# Patient Record
Sex: Male | Born: 1990 | Race: Black or African American | Hispanic: No | Marital: Single | Smoking: Never smoker
Health system: Southern US, Community
[De-identification: ages and names within clinical notes are randomized; demographics above are authoritative.]

## PROBLEM LIST (undated history)

## (undated) DIAGNOSIS — G809 Cerebral palsy, unspecified: Secondary | ICD-10-CM

## (undated) DIAGNOSIS — F909 Attention-deficit hyperactivity disorder, unspecified type: Secondary | ICD-10-CM

## (undated) DIAGNOSIS — R569 Unspecified convulsions: Secondary | ICD-10-CM

## (undated) DIAGNOSIS — N3281 Overactive bladder: Secondary | ICD-10-CM

## (undated) DIAGNOSIS — F840 Autistic disorder: Secondary | ICD-10-CM

## (undated) DIAGNOSIS — F79 Unspecified intellectual disabilities: Secondary | ICD-10-CM

## (undated) HISTORY — DX: Attention-deficit hyperactivity disorder, unspecified type: F90.9

## (undated) HISTORY — DX: Cerebral palsy, unspecified: G80.9

## (undated) HISTORY — PX: SHUNT REPLACEMENT: SHX5403

## (undated) HISTORY — DX: Autistic disorder: F84.0

---

## 2012-10-10 ENCOUNTER — Ambulatory Visit
Admission: RE | Admit: 2012-10-10 | Discharge: 2012-10-10 | Disposition: A | Discharge status: Home / Self Care | Payer: Medicare Other | Source: Ambulatory Visit | Attending: Family Medicine | Admitting: Family Medicine

## 2012-10-10 ENCOUNTER — Other Ambulatory Visit: Payer: Self-pay | Admitting: Family Medicine

## 2012-10-10 DIAGNOSIS — R1110 Vomiting, unspecified: Secondary | ICD-10-CM

## 2012-10-10 DIAGNOSIS — R630 Anorexia: Secondary | ICD-10-CM

## 2012-10-18 ENCOUNTER — Encounter: Payer: Self-pay | Admitting: Gastroenterology

## 2012-11-12 ENCOUNTER — Ambulatory Visit (INDEPENDENT_AMBULATORY_CARE_PROVIDER_SITE_OTHER)
Admission: RE | Admit: 2012-11-12 | Discharge: 2012-11-12 | Disposition: A | Discharge status: Home / Self Care | Payer: Medicare Other | Source: Ambulatory Visit | Attending: Gastroenterology | Admitting: Gastroenterology

## 2012-11-12 ENCOUNTER — Other Ambulatory Visit (INDEPENDENT_AMBULATORY_CARE_PROVIDER_SITE_OTHER): Payer: Medicare Other

## 2012-11-12 ENCOUNTER — Ambulatory Visit (INDEPENDENT_AMBULATORY_CARE_PROVIDER_SITE_OTHER): Payer: Medicare Other | Admitting: Gastroenterology

## 2012-11-12 ENCOUNTER — Encounter: Payer: Self-pay | Admitting: Gastroenterology

## 2012-11-12 VITALS — BP 114/70 | HR 84 | Ht 64.0 in | Wt 113.0 lb

## 2012-11-12 DIAGNOSIS — Z982 Presence of cerebrospinal fluid drainage device: Secondary | ICD-10-CM

## 2012-11-12 DIAGNOSIS — R112 Nausea with vomiting, unspecified: Secondary | ICD-10-CM

## 2012-11-12 LAB — COMPREHENSIVE METABOLIC PANEL
Albumin: 5.1 g/dL (ref 3.5–5.2)
Alkaline Phosphatase: 48 U/L (ref 39–117)
BUN: 23 mg/dL (ref 6–23)
CO2: 31 mEq/L (ref 19–32)
Calcium: 9.8 mg/dL (ref 8.4–10.5)
Chloride: 102 mEq/L (ref 96–112)
GFR: 113.47 mL/min (ref >60.00)
Glucose, Bld: 94 mg/dL (ref 70–99)
Potassium: 4.5 mEq/L (ref 3.5–5.1)
Sodium: 138 mEq/L (ref 135–145)
Total Protein: 8 g/dL (ref 6.0–8.3)

## 2012-11-12 LAB — CBC WITH DIFFERENTIAL/PLATELET
Basophils Relative: 0.5 % (ref 0.0–3.0)
Eosinophils Relative: 1.6 % (ref 0.0–5.0)
MCV: 90.5 fl (ref 78.0–100.0)
Monocytes Absolute: 0.6 10*3/uL (ref 0.1–1.0)
Monocytes Relative: 6.9 % (ref 3.0–12.0)
Neutrophils Relative %: 59 % (ref 43.0–77.0)
Platelets: 383 10*3/uL (ref 150.0–400.0)
RBC: 5.72 Mil/uL (ref 4.22–5.81)
WBC: 9 10*3/uL (ref 4.5–10.5)

## 2012-11-12 MED ORDER — IOHEXOL 300 MG/ML  SOLN: 80.0000 mL | Freq: Once | INTRAMUSCULAR | Status: AC | PRN

## 2012-11-12 NOTE — Patient Instructions (Addendum)
You will go to the basement today for labs You are scheduled for a CT scan at [facility] at [facility] at 4:15pm You can go over now to there office now if you would like Nothing else to eat or drink until after the CT scan

## 2012-11-12 NOTE — Assessment & Plan Note (Signed)
It is very difficult to assess this patient since he is not communicative. By exam there is nothing to suggest an acute intra-abdominal process. Anorexia and vomiting could be do to a malfunctioning VP shunt causing increased intracranial pressures. Esophageal stricture, ulcer or nonulcer dyspepsia are other possibilities.  Recommendations #1 CT of the head #2 to consider upper endoscopy if #1 is negative

## 2012-11-12 NOTE — Progress Notes (Signed)
History of Present Illness: 21 year old African American male with autism, cerebral palsy, mental retardation, with a VP shunt, referred for evaluation of anorexia, vomiting and weight loss. According to his mother, who is his caretaker, he has been eating very poorly for several weeks. He's lost weight. He's been vomiting and drooling. She thinks he may be in pain although he is unable to express himself.  An abdominal series 2 months ago was negative. The patient is noncommunicative.    Past Medical History  Diagnosis Date  . Autistic disorder   . Cerebral palsy   . Mental retardation   . Hyperactive    Past Surgical History  Procedure Date  . Shunt replacement    family history is not on file.  He is adopted. Current Outpatient Prescriptions  Medication Sig Dispense Refill  . ALPRAZolam (XANAX) 0.5 MG tablet       . MYRBETRIQ 25 MG TB24       . risperiDONE (RISPERDAL) 2 MG tablet       . SEROQUEL XR 50 MG TB24       . VESICARE 10 MG tablet        Allergies as of 11/12/2012  . (No Known Allergies)    reports that he has never smoked. He has never used smokeless tobacco. He reports that he does not drink alcohol or use illicit drugs.     Review of Systems: Pertinent positive and negative review of systems were noted in the above HPI section. All other review of systems were otherwise negative.  Vital signs were reviewed in today's medical record Physical Exam: General: Well developed , well nourished, no acute distress Head: Normocephalic and atraumatic Eyes:  sclerae anicteric, EOMI Ears: Normal auditory acuity Mouth: No deformity or lesions Neck: Supple, no masses or thyromegaly Lungs: Clear throughout to auscultation Heart: Regular rate and rhythm; no murmurs, rubs or bruits Abdomen: Soft, non tender and non distended. No masses, hepatosplenomegaly or hernias noted. Normal Bowel sounds Rectal:deferred Musculoskeletal: Symmetrical with no gross deformities  Skin: No  lesions on visible extremities Pulses:  Normal pulses noted Extremities: No clubbing, cyanosis, edema or deformities noted Neurological: Alert , moves all 4 extremities. He is noncommunicative.  Cervical Nodes:  No significant cervical adenopathy Inguinal Nodes: No significant inguinal adenopathy Psychological:  Alert and cooperative. Normal mood and affect

## 2012-11-14 NOTE — Progress Notes (Signed)
Quick Note:  The patient's symptoms can be explained by CT findings. Pacemaker referral to the neurosurgical group. Inform the family and the referring group that the patient is having nausea and vomiting and apparent abnormality of the ventricular peritoneal shunt ______

## 2012-11-19 ENCOUNTER — Encounter (HOSPITAL_COMMUNITY): Payer: Self-pay | Admitting: Pharmacy Technician

## 2012-11-19 ENCOUNTER — Other Ambulatory Visit (INDEPENDENT_AMBULATORY_CARE_PROVIDER_SITE_OTHER): Payer: Self-pay | Admitting: General Surgery

## 2012-11-19 ENCOUNTER — Other Ambulatory Visit: Payer: Self-pay | Admitting: Neurosurgery

## 2012-11-21 ENCOUNTER — Encounter (HOSPITAL_COMMUNITY): Payer: Self-pay | Admitting: *Deleted

## 2012-11-21 MED ORDER — CEFAZOLIN SODIUM-DEXTROSE 2-3 GM-% IV SOLR
2.0000 g | INTRAVENOUS | Status: AC
  Administered 2012-11-22: 2 g via INTRAVENOUS
  Filled 2012-11-21: qty 50

## 2012-11-22 ENCOUNTER — Encounter (HOSPITAL_COMMUNITY): Payer: Self-pay | Admitting: Certified Registered"

## 2012-11-22 ENCOUNTER — Encounter (HOSPITAL_COMMUNITY): Payer: Self-pay

## 2012-11-22 ENCOUNTER — Inpatient Hospital Stay (HOSPITAL_COMMUNITY)
Admission: RE | Admit: 2012-11-22 | Discharge: 2012-11-23 | DRG: 041 | Disposition: A | Discharge status: Home / Self Care | Payer: PRIVATE HEALTH INSURANCE | Source: Ambulatory Visit | Attending: Neurosurgery | Admitting: Neurosurgery

## 2012-11-22 ENCOUNTER — Ambulatory Visit (HOSPITAL_COMMUNITY): Payer: PRIVATE HEALTH INSURANCE | Admitting: Certified Registered"

## 2012-11-22 ENCOUNTER — Encounter (HOSPITAL_COMMUNITY)
Admission: RE | Disposition: A | Discharge status: Home / Self Care | Payer: Self-pay | Source: Ambulatory Visit | Attending: Neurosurgery

## 2012-11-22 DIAGNOSIS — Z79899 Other long term (current) drug therapy: Secondary | ICD-10-CM

## 2012-11-22 DIAGNOSIS — T85890A Other specified complication of nervous system prosthetic devices, implants and grafts, initial encounter: Principal | ICD-10-CM | POA: Diagnosis present

## 2012-11-22 DIAGNOSIS — F840 Autistic disorder: Secondary | ICD-10-CM | POA: Diagnosis present

## 2012-11-22 DIAGNOSIS — T85898A Other specified complication of other internal prosthetic devices, implants and grafts, initial encounter: Secondary | ICD-10-CM

## 2012-11-22 DIAGNOSIS — Y838 Other surgical procedures as the cause of abnormal reaction of the patient, or of later complication, without mention of misadventure at the time of the procedure: Secondary | ICD-10-CM | POA: Diagnosis present

## 2012-11-22 DIAGNOSIS — F79 Unspecified intellectual disabilities: Secondary | ICD-10-CM | POA: Diagnosis present

## 2012-11-22 DIAGNOSIS — G809 Cerebral palsy, unspecified: Secondary | ICD-10-CM | POA: Diagnosis present

## 2012-11-22 DIAGNOSIS — G911 Obstructive hydrocephalus: Secondary | ICD-10-CM | POA: Diagnosis present

## 2012-11-22 DIAGNOSIS — Y92009 Unspecified place in unspecified non-institutional (private) residence as the place of occurrence of the external cause: Secondary | ICD-10-CM

## 2012-11-22 HISTORY — DX: Unspecified intellectual disabilities: F79

## 2012-11-22 HISTORY — PX: LAPAROSCOPIC REVISION VENTRICULAR-PERITONEAL (V-P) SHUNT: SHX5924

## 2012-11-22 HISTORY — PX: SHUNT REVISION VENTRICULAR-PERITONEAL: SHX6094

## 2012-11-22 HISTORY — DX: Overactive bladder: N32.81

## 2012-11-22 LAB — SURGICAL PCR SCREEN: MRSA, PCR: NEGATIVE

## 2012-11-22 LAB — CBC
Hemoglobin: 17.6 g/dL — ABNORMAL HIGH (ref 13.0–17.0)
MCH: 29.9 pg (ref 26.0–34.0)
MCV: 87.8 fL (ref 78.0–100.0)
RBC: 5.88 MIL/uL — ABNORMAL HIGH (ref 4.22–5.81)

## 2012-11-22 SURGERY — REVISION, SHUNT, VENTRICULOPERITONEAL
Anesthesia: General | Wound class: Clean

## 2012-11-22 MED ORDER — SODIUM CHLORIDE 0.9 % IR SOLN
Status: DC | PRN
  Administered 2012-11-22: 1000 mL

## 2012-11-22 MED ORDER — QUETIAPINE FUMARATE ER 50 MG PO TB24
100.0000 mg | ORAL_TABLET | Freq: Every day | ORAL | Status: DC
  Administered 2012-11-22: 100 mg via ORAL
  Filled 2012-11-22 (×2): qty 2

## 2012-11-22 MED ORDER — GLYCOPYRROLATE 0.2 MG/ML IJ SOLN
0.2000 mg | Freq: Once | INTRAMUSCULAR | Status: AC
  Administered 2012-11-22: 0.2 mg via INTRAVENOUS

## 2012-11-22 MED ORDER — MENTHOL 3 MG MT LOZG: 1.0000 | LOZENGE | OROMUCOSAL | Status: DC | PRN

## 2012-11-22 MED ORDER — MIRABEGRON ER 25 MG PO TB24
25.0000 mg | ORAL_TABLET | Freq: Every day | ORAL | Status: DC
  Administered 2012-11-23: 25 mg via ORAL
  Filled 2012-11-22: qty 1

## 2012-11-22 MED ORDER — NEOSTIGMINE METHYLSULFATE 1 MG/ML IJ SOLN
INTRAMUSCULAR | Status: DC | PRN
  Administered 2012-11-22: 4 mg via INTRAVENOUS

## 2012-11-22 MED ORDER — SODIUM CHLORIDE 0.9 % IJ SOLN: 3.0000 mL | INTRAMUSCULAR | Status: DC | PRN

## 2012-11-22 MED ORDER — PROPOFOL 10 MG/ML IV BOLUS
INTRAVENOUS | Status: DC | PRN
  Administered 2012-11-22 (×2): 180 mg via INTRAVENOUS

## 2012-11-22 MED ORDER — ACETAMINOPHEN 650 MG RE SUPP: 650.0000 mg | RECTAL | Status: DC | PRN

## 2012-11-22 MED ORDER — PHENOL 1.4 % MT LIQD: 1.0000 | OROMUCOSAL | Status: DC | PRN

## 2012-11-22 MED ORDER — HYDROMORPHONE HCL PF 1 MG/ML IJ SOLN: 0.2500 mg | INTRAMUSCULAR | Status: DC | PRN

## 2012-11-22 MED ORDER — LIDOCAINE HCL (CARDIAC) 20 MG/ML IV SOLN
INTRAVENOUS | Status: DC | PRN
  Administered 2012-11-22 (×2): 40 mg via INTRAVENOUS

## 2012-11-22 MED ORDER — KCL IN DEXTROSE-NACL 20-5-0.45 MEQ/L-%-% IV SOLN
INTRAVENOUS | Status: DC
  Administered 2012-11-22: 21:00:00 via INTRAVENOUS
  Filled 2012-11-22 (×2): qty 1000

## 2012-11-22 MED ORDER — DOCUSATE SODIUM 100 MG PO CAPS
100.0000 mg | ORAL_CAPSULE | Freq: Two times a day (BID) | ORAL | Status: DC
  Administered 2012-11-22 – 2012-11-23 (×2): 100 mg via ORAL
  Filled 2012-11-22 (×2): qty 1

## 2012-11-22 MED ORDER — ACETAMINOPHEN 10 MG/ML IV SOLN: 1000.0000 mg | Freq: Once | INTRAVENOUS | Status: DC | PRN

## 2012-11-22 MED ORDER — ONDANSETRON HCL 4 MG/2ML IJ SOLN: 4.0000 mg | Freq: Once | INTRAMUSCULAR | Status: DC | PRN

## 2012-11-22 MED ORDER — ALUM & MAG HYDROXIDE-SIMETH 200-200-20 MG/5ML PO SUSP: 30.0000 mL | Freq: Four times a day (QID) | ORAL | Status: DC | PRN

## 2012-11-22 MED ORDER — GLYCOPYRROLATE 0.2 MG/ML IJ SOLN
INTRAMUSCULAR | Status: DC | PRN
  Administered 2012-11-22: 0.6 mg via INTRAVENOUS

## 2012-11-22 MED ORDER — ONDANSETRON HCL 4 MG/2ML IJ SOLN: 4.0000 mg | INTRAMUSCULAR | Status: DC | PRN

## 2012-11-22 MED ORDER — ROCURONIUM BROMIDE 100 MG/10ML IV SOLN
INTRAVENOUS | Status: DC | PRN
  Administered 2012-11-22: 15 mg via INTRAVENOUS
  Administered 2012-11-22: 10 mg via INTRAVENOUS
  Administered 2012-11-22: 35 mg via INTRAVENOUS

## 2012-11-22 MED ORDER — PANTOPRAZOLE SODIUM 40 MG IV SOLR
40.0000 mg | Freq: Every day | INTRAVENOUS | Status: DC
  Administered 2012-11-22: 40 mg via INTRAVENOUS
  Filled 2012-11-22 (×2): qty 40

## 2012-11-22 MED ORDER — FENTANYL CITRATE 0.05 MG/ML IJ SOLN
INTRAMUSCULAR | Status: DC | PRN
  Administered 2012-11-22: 100 ug via INTRAVENOUS
  Administered 2012-11-22: 50 ug via INTRAVENOUS

## 2012-11-22 MED ORDER — ONDANSETRON HCL 4 MG/2ML IJ SOLN
INTRAMUSCULAR | Status: DC | PRN
  Administered 2012-11-22: 4 mg via INTRAVENOUS

## 2012-11-22 MED ORDER — CEFAZOLIN SODIUM 1-5 GM-% IV SOLN
1.0000 g | Freq: Three times a day (TID) | INTRAVENOUS | Status: AC
  Administered 2012-11-23 (×2): 1 g via INTRAVENOUS
  Filled 2012-11-22 (×2): qty 50

## 2012-11-22 MED ORDER — LIDOCAINE-EPINEPHRINE 1 %-1:100000 IJ SOLN
INTRAMUSCULAR | Status: DC | PRN
  Administered 2012-11-22: 1.5 mL via INTRADERMAL

## 2012-11-22 MED ORDER — BUPIVACAINE HCL (PF) 0.5 % IJ SOLN
INTRAMUSCULAR | Status: DC | PRN
  Administered 2012-11-22: 1.5 mL

## 2012-11-22 MED ORDER — SODIUM CHLORIDE 0.9 % IJ SOLN
3.0000 mL | Freq: Two times a day (BID) | INTRAMUSCULAR | Status: DC
  Administered 2012-11-23: 3 mL via INTRAVENOUS

## 2012-11-22 MED ORDER — SODIUM CHLORIDE 0.9 % IV SOLN: 250.0000 mL | INTRAVENOUS | Status: DC

## 2012-11-22 MED ORDER — MORPHINE SULFATE 2 MG/ML IJ SOLN: 1.0000 mg | INTRAMUSCULAR | Status: DC | PRN

## 2012-11-22 MED ORDER — LACTATED RINGERS IV SOLN
INTRAVENOUS | Status: DC
  Administered 2012-11-22: 17:00:00 via INTRAVENOUS

## 2012-11-22 MED ORDER — KCL IN DEXTROSE-NACL 20-5-0.45 MEQ/L-%-% IV SOLN
INTRAVENOUS | Status: AC
  Filled 2012-11-22: qty 1000

## 2012-11-22 MED ORDER — MUPIROCIN 2 % EX OINT
TOPICAL_OINTMENT | CUTANEOUS | Status: AC
  Filled 2012-11-22: qty 22

## 2012-11-22 MED ORDER — HYDROCODONE-ACETAMINOPHEN 5-325 MG PO TABS
1.0000 | ORAL_TABLET | ORAL | Status: DC | PRN
  Administered 2012-11-22: 2 via ORAL
  Filled 2012-11-22: qty 2

## 2012-11-22 MED ORDER — OXYCODONE-ACETAMINOPHEN 5-325 MG PO TABS: 1.0000 | ORAL_TABLET | ORAL | Status: DC | PRN

## 2012-11-22 MED ORDER — ACETAMINOPHEN 325 MG PO TABS: 650.0000 mg | ORAL_TABLET | ORAL | Status: DC | PRN

## 2012-11-22 SURGICAL SUPPLY — 45 items
BENZOIN TINCTURE PRP APPL 2/3 (GAUZE/BANDAGES/DRESSINGS) ×2 IMPLANT
BLADE SURG 15 STRL LF DISP TIS (BLADE) ×1 IMPLANT
BLADE SURG 15 STRL SS (BLADE) ×1
CANISTER SUCTION 2500CC (MISCELLANEOUS) IMPLANT
CORDS BIPOLAR (ELECTRODE) ×2 IMPLANT
COVER MAYO STAND STRL (DRAPES) ×2 IMPLANT
DECANTER SPIKE VIAL GLASS SM (MISCELLANEOUS) ×2 IMPLANT
DRAPE INCISE IOBAN 66X45 STRL (DRAPES) ×2 IMPLANT
DRAPE INCISE IOBAN 85X60 (DRAPES) ×2 IMPLANT
DRSG TEGADERM 4X4.75 (GAUZE/BANDAGES/DRESSINGS) ×2 IMPLANT
GAUZE SPONGE 2X2 8PLY STRL LF (GAUZE/BANDAGES/DRESSINGS) ×1 IMPLANT
GLOVE BIO SURGEON STRL SZ 6.5 (GLOVE) ×4 IMPLANT
GLOVE BIO SURGEON STRL SZ8 (GLOVE) ×2 IMPLANT
GLOVE BIOGEL PI IND STRL 7.0 (GLOVE) ×1 IMPLANT
GLOVE BIOGEL PI IND STRL 7.5 (GLOVE) ×1 IMPLANT
GLOVE BIOGEL PI IND STRL 8 (GLOVE) ×2 IMPLANT
GLOVE BIOGEL PI INDICATOR 7.0 (GLOVE) ×1
GLOVE BIOGEL PI INDICATOR 7.5 (GLOVE) ×1
GLOVE BIOGEL PI INDICATOR 8 (GLOVE) ×2
GLOVE ECLIPSE 7.5 STRL STRAW (GLOVE) ×4 IMPLANT
GLOVE ECLIPSE 8.0 STRL XLNG CF (GLOVE) ×2 IMPLANT
GOWN BRE IMP SLV AUR LG STRL (GOWN DISPOSABLE) ×4 IMPLANT
GOWN BRE IMP SLV AUR XL STRL (GOWN DISPOSABLE) ×4 IMPLANT
GOWN STRL NON-REIN LRG LVL3 (GOWN DISPOSABLE) ×4 IMPLANT
IV NS 1000ML (IV SOLUTION) ×1
IV NS 1000ML BAXH (IV SOLUTION) ×1 IMPLANT
NS IRRIG 1000ML POUR BTL (IV SOLUTION) ×2 IMPLANT
PAD ARMBOARD 7.5X6 YLW CONV (MISCELLANEOUS) ×4 IMPLANT
PENCIL BUTTON HOLSTER BLD 10FT (ELECTRODE) ×2 IMPLANT
SCISSORS LAP 5X35 DISP (ENDOMECHANICALS) IMPLANT
SET IRRIG TUBING LAPAROSCOPIC (IRRIGATION / IRRIGATOR) ×2 IMPLANT
SET SHEATH INTRODUCER 10FR (MISCELLANEOUS) IMPLANT
SHEATH COOK PEEL AWAY SET 9F (SHEATH) ×2 IMPLANT
SLEEVE ENDOPATH XCEL 5M (ENDOMECHANICALS) ×2 IMPLANT
SPONGE GAUZE 2X2 STER 10/PKG (GAUZE/BANDAGES/DRESSINGS) ×1
STRIP CLOSURE SKIN 1/2X4 (GAUZE/BANDAGES/DRESSINGS) ×2 IMPLANT
SUT MNCRL AB 4-0 PS2 18 (SUTURE) ×2 IMPLANT
SUT MON AB 4-0 PC3 18 (SUTURE) IMPLANT
SUT VIC AB 3-0 SH 27 (SUTURE) ×1
SUT VIC AB 3-0 SH 27XBRD (SUTURE) ×1 IMPLANT
TOWEL OR 17X24 6PK STRL BLUE (TOWEL DISPOSABLE) ×4 IMPLANT
TOWEL OR 17X26 10 PK STRL BLUE (TOWEL DISPOSABLE) ×2 IMPLANT
TRAY LAPAROSCOPIC (CUSTOM PROCEDURE TRAY) ×2 IMPLANT
TROCAR XCEL BLUNT TIP 100MML (ENDOMECHANICALS) ×2 IMPLANT
TROCAR XCEL NON-BLD 5MMX100MML (ENDOMECHANICALS) ×2 IMPLANT

## 2012-11-22 NOTE — Progress Notes (Signed)
Heart rate flucutates from high 40's to 90's even after having robinal Dr. Randa Evens aware

## 2012-11-22 NOTE — Anesthesia Preprocedure Evaluation (Addendum)
Anesthesia Evaluation  Patient identified by MRN, date of birth, ID band Patient awake    Reviewed: Allergy & Precautions, H&P , NPO status , Patient's Chart, lab work & pertinent test results  Airway Mallampati: II TM Distance: >3 FB Neck ROM: Full    Dental  (+) Teeth Intact   Pulmonary  breath sounds clear to auscultation        Cardiovascular Rhythm:Regular Rate:Normal     Neuro/Psych PSYCHIATRIC DISORDERS  Neuromuscular disease    GI/Hepatic   Endo/Other    Renal/GU      Musculoskeletal   Abdominal   Peds  Hematology   Anesthesia Other Findings   Reproductive/Obstetrics                          Anesthesia Physical Anesthesia Plan  ASA: III  Anesthesia Plan: General   Post-op Pain Management:    Induction: Intravenous  Airway Management Planned: Oral ETT  Additional Equipment:   Intra-op Plan:   Post-operative Plan: Extubation in OR  Informed Consent: I have reviewed the patients History and Physical, chart, labs and discussed the procedure including the risks, benefits and alternatives for the proposed anesthesia with the patient or authorized representative who has indicated his/her understanding and acceptance.   Dental advisory given  Plan Discussed with: Anesthesiologist, Surgeon and CRNA  Anesthesia Plan Comments: (Chronic hydrocephalus with lifelong VP shunt now with outflow malfunction  Cerebral Palsy with autism  Kipp Brood, MD )       Anesthesia Quick Evaluation

## 2012-11-22 NOTE — Progress Notes (Signed)
Dr. Venetia Maxon returned call informed of patient's fluctuating heart rate and that he received robinal on arrival to PACU. Dr. Venetia Maxon feels it isn't a problem

## 2012-11-22 NOTE — Anesthesia Procedure Notes (Signed)
Procedure Name: Intubation Date/Time: 11/22/2012 5:59 PM Performed by: Arlice Colt B Pre-anesthesia Checklist: Patient identified, Emergency Drugs available, Suction available, Patient being monitored and Timeout performed Patient Re-evaluated:Patient Re-evaluated prior to inductionOxygen Delivery Method: Circle system utilized Preoxygenation: Pre-oxygenation with 100% oxygen Intubation Type: IV induction Ventilation: Mask ventilation without difficulty Laryngoscope Size: Mac and 3 Grade View: Grade I Tube type: Oral Tube size: 7.5 mm Number of attempts: 1 Airway Equipment and Method: Stylet Secured at: 22 cm Tube secured with: Tape Dental Injury: Teeth and Oropharynx as per pre-operative assessment

## 2012-11-22 NOTE — Transfer of Care (Signed)
Immediate Anesthesia Transfer of Care Note  Patient: Melvin Moore  Procedure(s) Performed: Procedure(s) (LRB) with comments: SHUNT REVISION VENTRICULAR-PERITONEAL (N/A) - Laparoscopic Repositioning of Ventricular-Peritoneal shunt  LAPAROSCOPIC REVISION VENTRICULAR-PERITONEAL (V-P) SHUNT (N/A) - Laparoscopic Repositioning of Ventricular-Peritoneal Shunt  Patient Location: PACU  Anesthesia Type:General  Level of Consciousness: sedated  Airway & Oxygen Therapy: Patient connected to face mask oxygen  Post-op Assessment: Report given to PACU RN, Post -op Vital signs reviewed and stable and Patient moving all extremities X 4  Post vital signs: Reviewed and stable  Complications: No apparent anesthesia complications

## 2012-11-22 NOTE — Anesthesia Postprocedure Evaluation (Signed)
  Anesthesia Post-op Note  Patient: Melvin Moore  Procedure(s) Performed: Procedure(s) (LRB) with comments: SHUNT REVISION VENTRICULAR-PERITONEAL (N/A) - Laparoscopic Repositioning of Ventricular-Peritoneal shunt  LAPAROSCOPIC REVISION VENTRICULAR-PERITONEAL (V-P) SHUNT (N/A) - Laparoscopic Repositioning of Ventricular-Peritoneal Shunt  Patient Location: PACU  Anesthesia Type:General  Level of Consciousness: awake  Airway and Oxygen Therapy: Patient Spontanous Breathing  Post-op Pain: mild  Post-op Assessment: Post-op Vital signs reviewed  Post-op Vital Signs: Reviewed  Complications: No apparent anesthesia complications

## 2012-11-22 NOTE — H&P (Signed)
Reason for Consult:Hydrocephalus and autism Referring Physician: Burdett Pinzon is an 21 y.o. male.  HPI: Disabled autist with congenital hydrocephalus last shunt revision [facility] 7 years ago, now with nausea, vomiting, subdued affect.  Head CT shows HCP.  Shunt series shows abdominal catheter is no longer in abdomen.  He is loosing weight.  His adoptive mother was with  Him and served as historian as he is non-verbal.   Past Medical History  Diagnosis Date  . Cerebral palsy   . Hyperactive   . Overactive bladder   . Autistic disorder   . Mental retardation   . MR (mental retardation)     "75%" per mother    Past Surgical History  Procedure Date  . Shunt replacement     Family History  Problem Relation Age of Onset  . Adopted: Yes    Social History:  reports that he has never smoked. He has never used smokeless tobacco. He reports that he does not drink alcohol or use illicit drugs.  Allergies: No Known Allergies  Medications:   Results for orders placed during the hospital encounter of 11/22/12 (from the past 48 hour(s))  CBC     Status: Abnormal   Collection Time   11/22/12  3:27 PM      Component Value Range Comment   WBC 8.1  4.0 - 10.5 K/uL    RBC 5.88 (*) 4.22 - 5.81 MIL/uL    Hemoglobin 17.6 (*) 13.0 - 17.0 g/dL    HCT 47.8  29.5 - 62.1 %    MCV 87.8  78.0 - 100.0 fL    MCH 29.9  26.0 - 34.0 pg    MCHC 34.1  30.0 - 36.0 g/dL    RDW 30.8  65.7 - 84.6 %    Platelets 271  150 - 400 K/uL     No results found.  Review of Systems -    Blood pressure 121/104, pulse 92, temperature 97.5 F (36.4 C), temperature source Oral, resp. rate 18, height 5\' 4"  (1.626 m), weight 51.256 kg (113 lb), SpO2 97.00%. Nonverbal, pleasant, stares.  Attends to voice.  Palpable mass over abdomen at site of abdominal catheter.  Shunt system appears intact over scalp. Assessment/Plan: VP shunt malfunction.  May be entirely secondary to abdominal catheter and not cranial  issue.  Will open over abdominal wound with Dr. Abbey Chatters and replace catheter in peritoneal cavity if there is spontaneous flow, otherwise, may have to revise cranial catheter as well.  Dorian Heckle, MD 11/22/2012, 4:52 PM

## 2012-11-22 NOTE — Progress Notes (Signed)
Dr. Randa Evens came to bedside wants DR. Venetia Maxon to be aware of heart fluctuations in the high 40"s to 90's Dr. Venetia Maxon paged

## 2012-11-22 NOTE — Consult Note (Signed)
Reason for Consult:Malfunctioning ventriculoperitoneal shunt Referring Physician: Dr. Maeola Moore  Melvin Moore is an 21 y.o. male.  HPI: He has a long-standing ventriculoperitoneal shunt in for congenital hydrocephalus. Recently he's been more sedated somewhat nauseated. He CT scan does show significant hydrocephalus. A plain abdominal x-ray suggested the shunt may have migrated out of the abdominal cavity. The plan is to see if the shunt is in the abdominal cavity. If it is not and it is functioning it can be placed back into the abdominal cavity under laparoscopic guidance. If the shunt is occluded, it may need to be completely replaced.  Past Medical History  Diagnosis Date  . Cerebral palsy   . Hyperactive   . Overactive bladder   . Autistic disorder   . Mental retardation   . MR (mental retardation)     "75%" per mother    Past Surgical History  Procedure Date  . Shunt replacement     Family History  Problem Relation Age of Onset  . Adopted: Yes    Social History:  reports that he has never smoked. He has never used smokeless tobacco. He reports that he does not drink alcohol or use illicit drugs.  Allergies: No Known Allergies  Prior to Admission medications   Medication Sig Start Date End Date Taking? Authorizing Provider  MYRBETRIQ 25 MG TB24 Take 25 mg by mouth daily.  10/23/12  Yes Historical Provider, MD  SEROQUEL XR 50 MG TB24 Take 100 mg by mouth at bedtime.  09/11/12  Yes Historical Provider, MD     Results for orders placed during the hospital encounter of 11/22/12 (from the past 48 hour(s))  SURGICAL PCR SCREEN     Status: Normal   Collection Time   11/22/12  3:25 PM      Component Value Range Comment   MRSA, PCR NEGATIVE  NEGATIVE    Staphylococcus aureus NEGATIVE  NEGATIVE   CBC     Status: Abnormal   Collection Time   11/22/12  3:27 PM      Component Value Range Comment   WBC 8.1  4.0 - 10.5 K/uL    RBC 5.88 (*) 4.22 - 5.81 MIL/uL    Hemoglobin 17.6 (*) 13.0 - 17.0 g/dL    HCT 16.1  09.6 - 04.5 %    MCV 87.8  78.0 - 100.0 fL    MCH 29.9  26.0 - 34.0 pg    MCHC 34.1  30.0 - 36.0 g/dL    RDW 40.9  81.1 - 91.4 %    Platelets 271  150 - 400 K/uL     No results found.  Review of Systems  Unable to perform ROS  Blood pressure 121/104, pulse 92, temperature 97.5 F (36.4 C), temperature source Oral, resp. rate 18, height 5\' 4"  (1.626 m), weight 113 lb (51.256 kg), SpO2 97.00%. Physical Exam  Constitutional: He appears well-developed and well-nourished.  GI: Soft.       Palpable catheter-type foreign body in left upper quadrant region. Small subxiphoid for incision. Small lower epigastric incision.    Assessment/Plan: Hydrocephalus with malfunctioning ventriculoperitoneal shunt. The shunt may be pulled out of the abdominal cavity as he is very active.  Plan: Laparoscopic assisted placement of ventriculoperitoneal shunt. I've discussed the procedure the risks with his mother. The risks included but not limited to bleeding, infection, wound healing problems, anesthesia, accidental injury to intra-abdominal organs.  Melvin Moore 11/22/2012, 5:31 PM

## 2012-11-22 NOTE — Op Note (Signed)
Operative Note  Melvin Moore male 21 y.o. 11/22/2012  PREOPERATIVE DX:  Hydrocephalus secondary to malfunctioning/malpositioned ventriculoperitoneal shunt  POSTOPERATIVE DX:  Same  PROCEDURE:  Abdominal wall exploration and laparoscopic repositioning of ventriculoperitoneal shunt         Surgeon: Adolph Pollack   Assistants: Maeola Harman M.D.  Anesthesia: General endotracheal anesthesia  Indications: This is a 21 year old autistic male with long-standing hydrocephalus. He has a long-standing ventriculoperitoneal shunt. He's been noted to be more somnolent as well as have problems with some nausea and vomiting. CT scan demonstrates some significant hydrocephalus. Plain x-rays demonstrate that the shunt catheter appears to be outside the peritoneal cavity and tracking back up to the chest cavity. This is also palpable on exam. He now presents for the above procedures.    Procedure Detail:  He was brought to the operating room placed supine on the operating table and a general anesthetic was administered. The hair on the abdominal wall chest, and left-side on the scalp was clipped and these areas were sterilely prepped and draped. A previous incision in the epigastrium was re-incised sharply and the subcutaneous tissue was divided until we identified the shunt catheter. The catheter was somewhat looped with part of it going back up the chest wall. We were able then to manipulate the shunt catheter such that we were able to pull it out. The tip was not in the peritoneal cavity. Once the shunt catheter was pulled out, adequate length was noted and it was draining well.  Next, a small incision was made in the left upper quadrant and access was gained into the peritoneal cavity using a 5 mm Optiview trocar and laparoscope.  A pneumoperitoneum was created by insufflation of CO2 gas. Dense omental adhesions were noted to the abdominal wall. There is no evidence of bleeding or organ injury  under the trocar sites. I carefully separated some of these adhesions allowing me to get into the left lower quadrant. A second 5 mm trocar was and placed through a small incision in the left lower quadrant.  A 16-gauge needle was then introduced into the peritoneal cavity through the small epigastric incision. This was followed by a wire and then a dilator introducer complex. The dilator was removed. The catheter was threaded through the peel-away sheath introducer into the peritoneal cavity and the peel-away sheath was removed.  The shunt catheter was then positioned in the pelvis.  I then inspected the abdominal cavity and there is no evidence of bleeding or organ injury. The trocars were removed and the pneumoperitoneum was released.  The subcutaneous tissue of the epigastric incision was closed with interrupted 3-0 Vicryl sutures. All 3 skin incisions were then closed with 4-0 Monocryl subcuticular stitches. Steri-Strips and sterile dressings were applied.  He tolerated the procedure well without any apparent complications and was taken to the recovery room in satisfactory condition.  Estimated Blood Loss:  less than 100 mL         Drains: none  Blood Given: none          Specimens: None        Complications:  * No complications entered in OR log *         Disposition: PACU - hemodynamically stable.         Condition: stable

## 2012-11-22 NOTE — Progress Notes (Signed)
Dr Mora Bellman called regarding heart rate down to 42 orders received to give robinal 0.2 IVP

## 2012-11-23 MED ORDER — HYDROCODONE-ACETAMINOPHEN 5-325 MG PO TABS: 1.0000 | ORAL_TABLET | ORAL | Status: DC | PRN

## 2012-11-23 NOTE — Progress Notes (Addendum)
General surgery progress note:  Patient is stable. No complaints.  No nausea.  Sister in room. He tolerated breakfast very well.  Exam: Abdomen soft. Nondistended. Nontender. Wounds looked fine.  Assessment/plan:   POD #1. Revision of ventriculoperitoneal shunt with laparoscopic positioning of peritoneal catheter. Doing well from abdominal surgery standpoint. May be discharged home when okay with neurosurgery. Followup with Dr. Okey Dupre about if further problems arise.   Angelia Mould. Derrell Lolling, M.D., [facility] General and Minimally invasive Surgery Breast and Colorectal Surgery Office:   [phone] Pager:   [phone]

## 2012-11-23 NOTE — Progress Notes (Signed)
Subjective: Patient reports appears comfortable.  Objective: Vital signs in last 24 hours: Temp:  [97 F (36.1 C)-99.1 F (37.3 C)] 97.3 F (36.3 C) (12/14 0600) Pulse Rate:  [42-92] 55  (12/14 0600) Resp:  [12-21] 16  (12/14 0600) BP: (91-121)/(42-104) 102/43 mmHg (12/14 0600) SpO2:  [97 %-100 %] 100 % (12/14 0600) Weight:  [50.8 kg (111 lb 15.9 oz)-51.256 kg (113 lb)] 50.8 kg (111 lb 15.9 oz) (12/13 2200)  Intake/Output from previous day: 12/13 0701 - 12/14 0700 In: 900 [I.V.:900] Out: 100 [Urine:100] Intake/Output this shift:    Physical Exam: Belly soft, nontender.  Tolerating po intake well.  Does not appear uncomfortable.  Lab Results:  [facility] 11/22/12 1527  WBC 8.1  HGB 17.6*  HCT 51.6  PLT 271   BMET No results found for this basename: NA:2,K:2,CL:2,CO2:2,GLUCOSE:2,BUN:2,CREATININE:2,CALCIUM:2 in the last 72 hours  Studies/Results: No results found.  Assessment/Plan: D/C home with F/U 3-4 weeks in office.    LOS: 1 day    Dorian Heckle, MD 11/23/2012, 7:51 AM

## 2012-11-23 NOTE — Progress Notes (Signed)
Discharge Note: Pt is alert and oriented, VS are stable, denies CP.  Telemetry & IV discontinued.  Discharge instructions reviewed with patient, pt verbalizes understanding.  Wheelchair transportation provided, all belongings with patient  

## 2012-11-23 NOTE — Discharge Summary (Signed)
Physician Discharge Summary  Patient ID: Melvin Moore MRN: [id number] DOB/AGE: 07/02/1991 21 y.o.  Admit date: 11/22/2012 Discharge date: 11/23/2012  Admission Diagnoses:Ventriculoperitoneal shunt malfunction  Discharge Diagnoses: Ventriculoperitoneal shunt malfunction Active Problems:  * No active hospital problems. *    Discharged Condition: good  Hospital Course: Patient underwent laparoscopic revision of abdominal shunt catheter and did well  Consults: general surgery  Significant Diagnostic Studies: None  Treatments: surgery:  laparoscopic revision of abdominal shunt catheter  Discharge Exam: Blood pressure 119/73, pulse 69, temperature 98.4 F (36.9 C), temperature source Oral, resp. rate 16, height 5' 4" (1.626 m), weight 50.8 kg (111 lb 15.9 oz), SpO2 100.00%. Wound:CDI.Patient awakens, attends, nonverbal.  No abdominal discomfort.  Disposition: Home     Medication List     As of 11/23/2012 11:05 AM    TAKE these medications         HYDROcodone-acetaminophen 5-325 MG per tablet   Commonly known as: NORCO/VICODIN   Take 1-2 tablets by mouth every 4 (four) hours as needed.      MYRBETRIQ 25 MG Tb24   Generic drug: mirabegron ER   Take 25 mg by mouth daily.      SEROQUEL XR 50 MG Tb24   Generic drug: QUEtiapine   Take 100 mg by mouth at bedtime.         Signed: Darci Lykins D, MD 11/23/2012, 11:05 AM   

## 2012-11-23 NOTE — Progress Notes (Signed)
Patient ID: Melvin Moore, male   DOB: 1991/02/14, 21 y.o.   MRN: [id number] 1 Day Post-Op  Subjective: Pt is sleeping.  Sister reports he just ate all of his breakfast.  Has minimal abdominal pain.  Objective: Vital signs in last 24 hours: Temp:  [97 F (36.1 C)-99.1 F (37.3 C)] 97.3 F (36.3 C) (12/14 0600) Pulse Rate:  [42-92] 55  (12/14 0600) Resp:  [12-21] 16  (12/14 0600) BP: (91-121)/(42-104) 102/43 mmHg (12/14 0600) SpO2:  [97 %-100 %] 100 % (12/14 0600) Weight:  [111 lb 15.9 oz (50.8 kg)-113 lb (51.256 kg)] 111 lb 15.9 oz (50.8 kg) (12/13 2200)    Intake/Output from previous day: 12/13 0701 - 12/14 0700 In: 900 [I.V.:900] Out: 100 [Urine:100] Intake/Output this shift:    PE: Abd: soft, minimally tender, incisions c/d/i with gauze and tegaderm.  Old drainage noted on upper incision. Heart: regular  Lab Results:   Basename 11/22/12 1527  WBC 8.1  HGB 17.6*  HCT 51.6  PLT 271   BMET No results found for this basename: NA:2,K:2,CL:2,CO2:2,GLUCOSE:2,BUN:2,CREATININE:2,CALCIUM:2 in the last 72 hours PT/INR No results found for this basename: LABPROT:2,INR:2 in the last 72 hours CMP     Component Value Date/Time   NA 138 11/12/2012 1438   K 4.5 11/12/2012 1438   CL 102 11/12/2012 1438   CO2 31 11/12/2012 1438   GLUCOSE 94 11/12/2012 1438   BUN 23 11/12/2012 1438   CREATININE 1.0 11/12/2012 1438   CALCIUM 9.8 11/12/2012 1438   PROT 8.0 11/12/2012 1438   ALBUMIN 5.1 11/12/2012 1438   AST 14 11/12/2012 1438   ALT 11 11/12/2012 1438   ALKPHOS 48 11/12/2012 1438   BILITOT 1.1 11/12/2012 1438   Lipase  No results found for this basename: lipase       Studies/Results: No results found.  Anti-infectives: Anti-infectives     Start     Dose/Rate Route Frequency Ordered Stop   11/23/12 0200   ceFAZolin (ANCEF) IVPB 1 g/50 mL premix        1 g 100 mL/hr over 30 Minutes Intravenous Every 8 hours 11/22/12 2224 11/23/12 1759   11/22/12 0600   ceFAZolin (ANCEF)  IVPB 2 g/50 mL premix        2 g 100 mL/hr over 30 Minutes Intravenous On call to O.R. 11/21/12 1413 11/22/12 1805           Assessment/Plan  1. S/p laparoscopic repositioning of VP shunt  Plan:  1. The patient looks good from a general surgery standpoint.  Further care per neurosurg.   LOS: 1 day    Javarie Crisp E 11/23/2012, 9:13 AM Pager: [phone]

## 2012-11-23 NOTE — Progress Notes (Signed)
Agree. See my progress note.  Melvin Moore. Derrell Lolling, M.D., [facility] General and Minimally invasive Surgery Breast and Colorectal Surgery Office:   [phone] Pager:   [phone]

## 2012-11-23 NOTE — Progress Notes (Signed)
Physician Discharge Summary  Patient ID: KALEAB FRASIER MRN: [id number] DOB/AGE: 02-Nov-1991 21 y.o.  Admit date: 11/22/2012 Discharge date: 11/23/2012  Admission Diagnoses:Ventriculoperitoneal shunt malfunction  Discharge Diagnoses: Ventriculoperitoneal shunt malfunction Active Problems:  * No active hospital problems. *    Discharged Condition: good  Hospital Course: Patient underwent laparoscopic revision of abdominal shunt catheter and did well  Consults: general surgery  Significant Diagnostic Studies: None  Treatments: surgery:  laparoscopic revision of abdominal shunt catheter  Discharge Exam: Blood pressure 119/73, pulse 69, temperature 98.4 F (36.9 C), temperature source Oral, resp. rate 16, height 5\' 4"  (1.626 m), weight 50.8 kg (111 lb 15.9 oz), SpO2 100.00%. Wound:CDI.Patient awakens, attends, nonverbal.  No abdominal discomfort.  Disposition: Home     Medication List     As of 11/23/2012 11:05 AM    TAKE these medications         HYDROcodone-acetaminophen 5-325 MG per tablet   Commonly known as: NORCO/VICODIN   Take 1-2 tablets by mouth every 4 (four) hours as needed.      MYRBETRIQ 25 MG Tb24   Generic drug: mirabegron ER   Take 25 mg by mouth daily.      SEROQUEL XR 50 MG Tb24   Generic drug: QUEtiapine   Take 100 mg by mouth at bedtime.         Signed: Dorian Heckle, MD 11/23/2012, 11:05 AM

## 2012-11-25 ENCOUNTER — Encounter (HOSPITAL_COMMUNITY): Payer: Self-pay | Admitting: Neurosurgery

## 2013-01-01 ENCOUNTER — Other Ambulatory Visit: Payer: Self-pay | Admitting: Neurosurgery

## 2013-01-01 DIAGNOSIS — Q039 Congenital hydrocephalus, unspecified: Secondary | ICD-10-CM

## 2013-01-01 DIAGNOSIS — Z982 Presence of cerebrospinal fluid drainage device: Secondary | ICD-10-CM

## 2013-01-03 ENCOUNTER — Ambulatory Visit
Admission: RE | Admit: 2013-01-03 | Discharge: 2013-01-03 | Disposition: A | Discharge status: Home / Self Care | Payer: Medicare Other | Source: Ambulatory Visit | Attending: Neurosurgery | Admitting: Neurosurgery

## 2013-01-03 ENCOUNTER — Ambulatory Visit
Admission: RE | Admit: 2013-01-03 | Discharge: 2013-01-03 | Disposition: A | Discharge status: Home / Self Care | Payer: Medicaid Other | Source: Ambulatory Visit | Attending: Neurosurgery | Admitting: Neurosurgery

## 2013-01-03 DIAGNOSIS — Q039 Congenital hydrocephalus, unspecified: Secondary | ICD-10-CM

## 2013-01-03 DIAGNOSIS — Z982 Presence of cerebrospinal fluid drainage device: Secondary | ICD-10-CM

## 2013-03-19 ENCOUNTER — Telehealth: Payer: Self-pay | Admitting: Family Medicine

## 2013-03-20 MED ORDER — ALPRAZOLAM 0.5 MG PO TABS: 0.5000 mg | ORAL_TABLET | Freq: Every evening | ORAL | Status: DC | PRN

## 2013-03-20 NOTE — Telephone Encounter (Signed)
Do not take both together, use seroquel and give him xana prn until he is seen.
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
## 2013-03-20 NOTE — Telephone Encounter (Signed)
Temporarily, try xanax 0.5 mg poqhs but I need to see them to discuss.

## 2013-03-20 NOTE — Telephone Encounter (Signed)
Pt aware to take seroquel and give xanax PRN

## 2013-03-20 NOTE — Telephone Encounter (Signed)
I tried to e-scribe but it would not. Can you call in xanax.

## 2013-03-27 ENCOUNTER — Telehealth: Payer: Self-pay | Admitting: Family Medicine

## 2013-03-27 MED ORDER — QUETIAPINE FUMARATE ER 50 MG PO TB24: 100.0000 mg | ORAL_TABLET | Freq: Every day | ORAL | Status: DC

## 2013-03-27 MED ORDER — ALPRAZOLAM 0.5 MG PO TABS: 0.5000 mg | ORAL_TABLET | Freq: Every day | ORAL | Status: DC | PRN

## 2013-03-27 NOTE — Telephone Encounter (Signed)
Please follow the advice from last phone call.  He can have xanax called in but I need to see him.
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
## 2013-03-27 NOTE — Telephone Encounter (Signed)
Spoke to mother.  Reinforce to keep OV fro 4/28.  One refill of xanax for prn use during the day for agitation.  And seroquel for at bedtime.

## 2013-04-07 ENCOUNTER — Ambulatory Visit: Payer: Self-pay | Admitting: Family Medicine

## 2013-04-14 ENCOUNTER — Ambulatory Visit (INDEPENDENT_AMBULATORY_CARE_PROVIDER_SITE_OTHER): Payer: PRIVATE HEALTH INSURANCE | Admitting: Family Medicine

## 2013-04-14 ENCOUNTER — Encounter: Payer: Self-pay | Admitting: Family Medicine

## 2013-04-14 VITALS — BP 130/70 | HR 98 | Temp 98.4°F | Resp 18 | Wt 102.0 lb

## 2013-04-14 DIAGNOSIS — F79 Unspecified intellectual disabilities: Secondary | ICD-10-CM | POA: Insufficient documentation

## 2013-04-14 DIAGNOSIS — G809 Cerebral palsy, unspecified: Secondary | ICD-10-CM | POA: Insufficient documentation

## 2013-04-14 DIAGNOSIS — F840 Autistic disorder: Secondary | ICD-10-CM | POA: Insufficient documentation

## 2013-04-14 MED ORDER — QUETIAPINE FUMARATE ER 200 MG PO TB24: 200.0000 mg | ORAL_TABLET | Freq: Every day | ORAL | Status: DC

## 2013-04-14 NOTE — Progress Notes (Signed)
Subjective:    Patient ID: Melvin Moore, male    DOB: 08-27-1991, 22 y.o.   MRN: [id number]  HPI  Patient is a 22 year old male with history of cerebral palsy, mental retardation, and autism. His pacer seen by psychiatry. They placed the patient on Seroquel XR 50 mg by mouth each bedtime. Per the grandmother that this was not working to control his aggressive behavior.  Furthermore she did not like the psychiatrist and she stopped seeing him. The patient was next seen by neurology, Dr. Anne Moore. Dr. Anne Moore prescribed patient with Risperdal 1 mg by mouth each bedtime. GM stated that this helped somewhat control his behavior and help him sleep but only for about a week and it seemed to lose its effectiveness. She states she was unable to get back in touch with neurology after that. Therefore February he discontinued the medicine altogether.  However then the grandmother began to get complaints from school. He was yelling at the staff. He was crying and screaming for no reason. He was having increasing confusion. School staff also reported the patient was jumping and jerking with minimal external stimuli. He is also avoiding to swallow his saliva.  They informed the grandmother that this all seemed to happen when the patient discontinued this route. Therefore in April, the grandmother contacted my office and wanted to know whether she should patient back on wrist and all are Seroquel. She states is unable to give that for the psychiatrist or the neurologist. We recommended Seroquel Exelon 50 mg, 2 tablets by mouth each bedtime. She states this is helping very little but is helping some. She states it seems to be working better than the risk of all. Furthermore she's also using Xanax 0.5 mg at night to help him sleep. Past Medical History  Diagnosis Date  . Cerebral palsy   . Hyperactive   . Overactive bladder   . Autistic disorder   . Mental retardation   . MR (mental retardation)     "75%" per  mother   Current Outpatient Prescriptions on File Prior to Visit  Medication Sig Dispense Refill  . ALPRAZolam (XANAX) 0.5 MG tablet Take 1 tablet (0.5 mg total) by mouth daily as needed for sleep.  30 tablet  0   No current facility-administered medications on file prior to visit.   No Known Allergies History   Social History  . Marital Status: Single    Spouse Name: N/A    Number of Children: N/A  . Years of Education: N/A   Occupational History  . Disabled    Social History Main Topics  . Smoking status: Never Smoker   . Smokeless tobacco: Never Used  . Alcohol Use: No  . Drug Use: No  . Sexually Active: Not on file   Other Topics Concern  . Not on file   Social History Narrative  . No narrative on file     Review of Systems    review of systems is otherwise negative Objective:   Physical Exam  Vitals reviewed. Cardiovascular: Normal rate, regular rhythm and normal heart sounds.  Exam reveals no gallop.   No murmur heard. Pulmonary/Chest: Effort normal and breath sounds normal. No respiratory distress. He has no wheezes.  Abdominal: Soft. Bowel sounds are normal. He exhibits no distension. There is no tenderness. There is no rebound and no guarding.  Neurological: He is alert. No cranial nerve deficit. He exhibits abnormal muscle tone. Coordination abnormal.   he has severe cerebral palsy with  contractures in the upper and lower extremities He did not speak but grunts in mind frequently throughout the exam. Furthermore he has jerking motions in his arms and legs seem to be myoclonic jerks.       Assessment & Plan:  1. Autistic disorder Increased cerebral XR 200 mg by mouth each bedtime. Increases further 300 mg by mouth each bedtime, if the patient continues to display aggressive behaviors and continues to see benefit by increasing the dose. Will also consult psychiatry at [facility] where he sees the neurosurgeon for expert opinion. I will try to manage  this as best I can until that consultation is arranged. - Ambulatory referral to Psychiatry  2. Mental retardation 3. Cerebral palsy

## 2013-04-21 ENCOUNTER — Other Ambulatory Visit: Payer: Self-pay | Admitting: Family Medicine

## 2013-04-21 NOTE — Telephone Encounter (Signed)
?   OK to Refill  

## 2013-04-21 NOTE — Telephone Encounter (Signed)
Ok to fill 

## 2013-04-22 ENCOUNTER — Other Ambulatory Visit: Payer: Self-pay | Admitting: Family Medicine

## 2013-04-22 NOTE — Telephone Encounter (Signed)
Ok to fill 

## 2013-04-22 NOTE — Telephone Encounter (Signed)
?   OK to Refill  

## 2013-04-25 ENCOUNTER — Telehealth: Payer: Self-pay | Admitting: Family Medicine

## 2013-04-25 NOTE — Telephone Encounter (Signed)
Error- please disregard

## 2013-05-08 ENCOUNTER — Telehealth: Payer: Self-pay | Admitting: Family Medicine

## 2013-05-08 MED ORDER — QUETIAPINE FUMARATE 300 MG PO TABS: 300.0000 mg | ORAL_TABLET | Freq: Every day | ORAL | Status: DC

## 2013-05-08 MED ORDER — TRAZODONE HCL 50 MG PO TABS: 50.0000 mg | ORAL_TABLET | Freq: Every day | ORAL | Status: DC

## 2013-05-08 NOTE — Telephone Encounter (Signed)
Increase seroquel xr to 300 mg poqhs.  Add trazodone 50 mg poqhs prn insomnia.  He needs to see psychiatry, especially if this doesn't work.  Where does the referral to [facility] stand?

## 2013-05-08 NOTE — Telephone Encounter (Signed)
Pt mom wants to know if you can increase his seroquel to 300mg  states he hasnt gotten better and also can you give him something to help him sleep he still isnt sleeping good.

## 2013-05-08 NOTE — Telephone Encounter (Signed)
Med rf °

## 2013-05-23 ENCOUNTER — Other Ambulatory Visit: Payer: Self-pay | Admitting: Family Medicine

## 2013-05-23 NOTE — Telephone Encounter (Signed)
Ok to refill 

## 2013-05-23 NOTE — Telephone Encounter (Signed)
?   OK to Refill  

## 2013-06-03 ENCOUNTER — Telehealth: Payer: Self-pay | Admitting: Neurology

## 2013-06-03 NOTE — Telephone Encounter (Signed)
I called and spoke with patient's sister to have Mubashir to callback to the office to r/s his appt. with Dr. Anne Hahn.

## 2013-06-04 ENCOUNTER — Other Ambulatory Visit: Payer: Self-pay | Admitting: Family Medicine

## 2013-06-04 MED ORDER — QUETIAPINE FUMARATE ER 300 MG PO TB24: 300.0000 mg | ORAL_TABLET | Freq: Every day | ORAL | Status: DC

## 2013-06-04 NOTE — Telephone Encounter (Signed)
pts medication had gotten mixed up and the pharmacy had given him the 50mg  seroquel and pts mom said that did not help at all. I faxed the new rx to [facility]. And put a note on it to dc all other doses of Seroquel except for 300mg  XR.

## 2013-06-09 ENCOUNTER — Telehealth: Payer: Self-pay | Admitting: Family Medicine

## 2013-06-09 ENCOUNTER — Other Ambulatory Visit: Payer: Self-pay | Admitting: Family Medicine

## 2013-06-09 MED ORDER — TRAZODONE HCL 50 MG PO TABS: 50.0000 mg | ORAL_TABLET | Freq: Every day | ORAL | Status: DC

## 2013-06-09 NOTE — Telephone Encounter (Signed)
Rx Refilled  

## 2013-06-22 IMAGING — CT CT ABDOMEN W/O CM
2 of 4 series · 17 of 46 positions shown, 19 images · non-contrast
Comparison: Abdominal films 12/23/2012 and earlier.

CLINICAL DATA: 21-year-old male with ventriculoperitoneal shunt..
Weight loss and constipation.

CT ABDOMEN WITHOUT CONTRAST
TECHNIQUE: Multidetector CT imaging of the abdomen was performed
following the standard protocol without IV contrast.

[Series 2: abdomen w/o · axial · non-contrast · 0.64mm/px · z∈[-185,+55]mm · 14 of 54 slices shown, 16 images]
[im 3/54  soft-tissue]
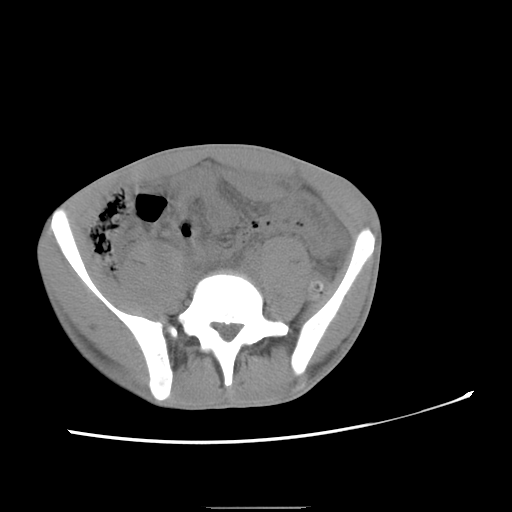
[im 3/54  bone]
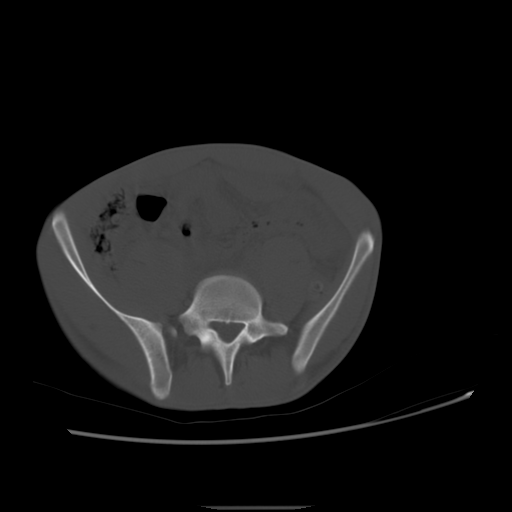
[im 8/54  soft-tissue]
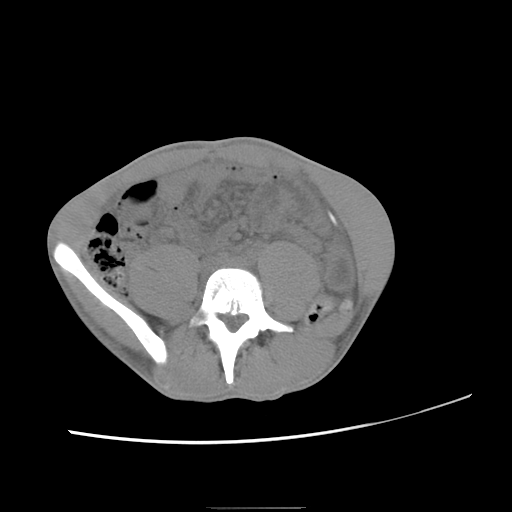
[im 10/54  soft-tissue]
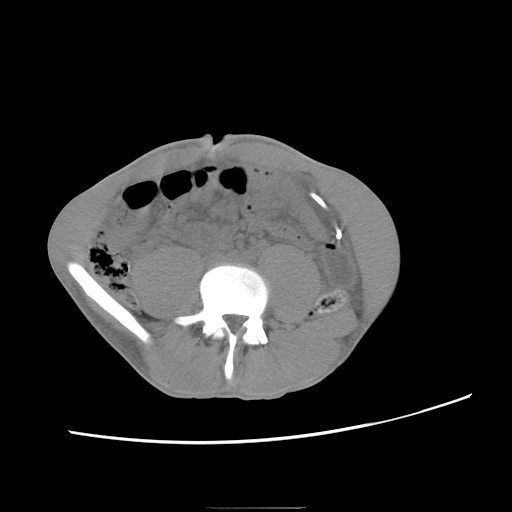
[im 15/54  soft-tissue]
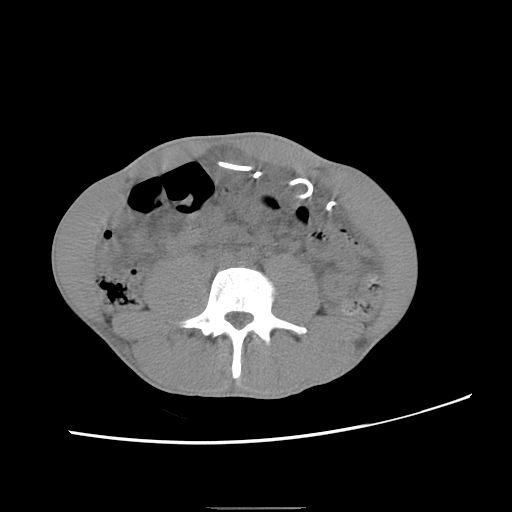
[im 17/54  soft-tissue]
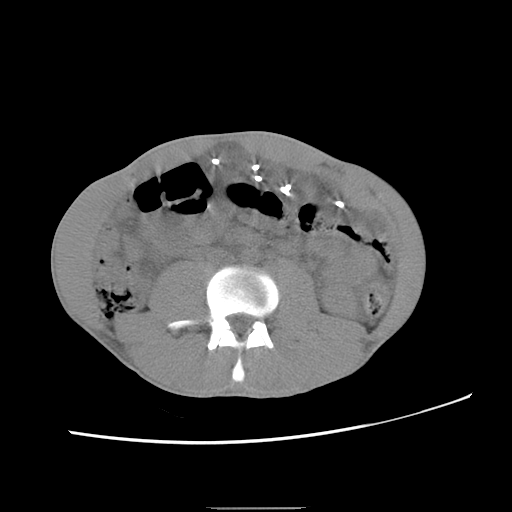
[im 22/54  soft-tissue]
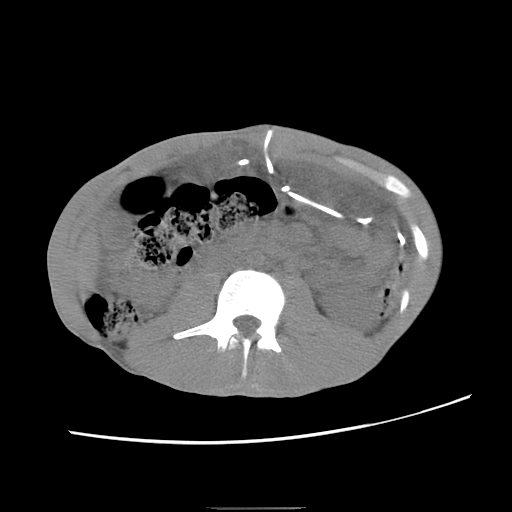
[im 25/54  soft-tissue]
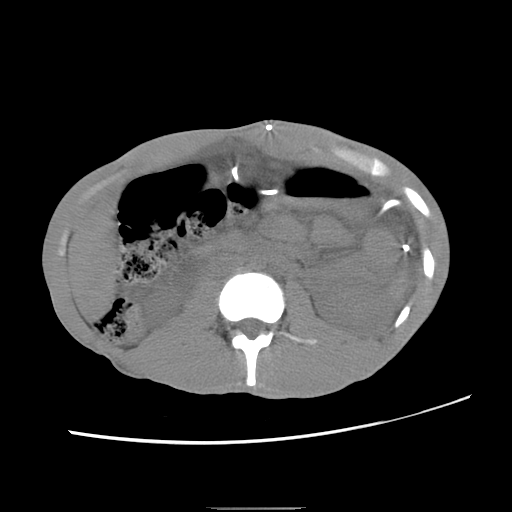
[im 29/54  soft-tissue]
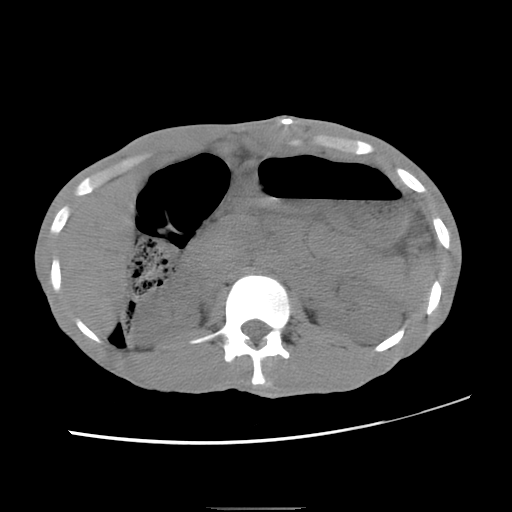
[im 32/54  soft-tissue]
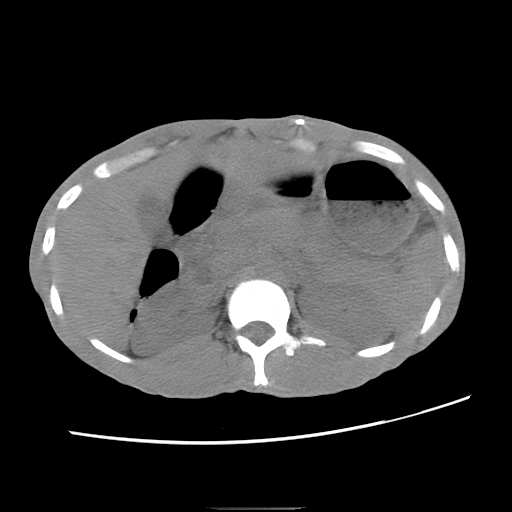
[im 32/54  bone]
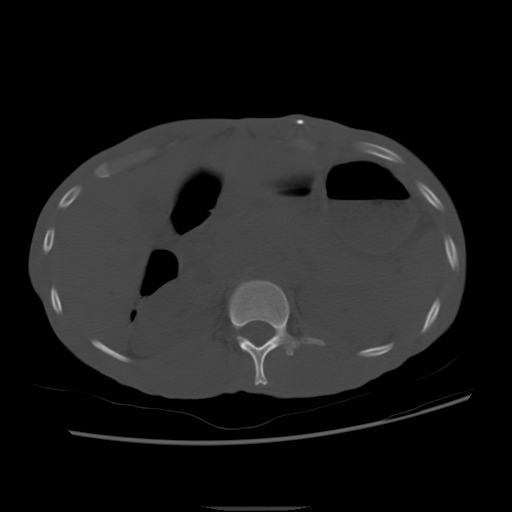
[im 37/54  soft-tissue]
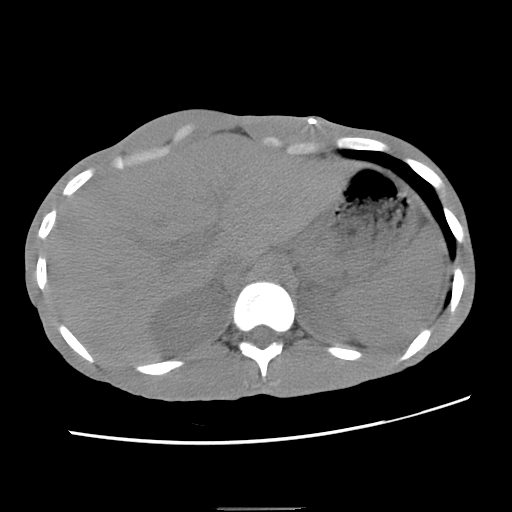
[im 39/54  soft-tissue]
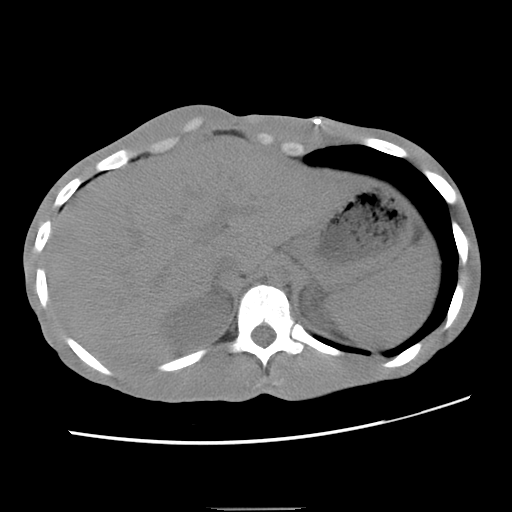
[im 44/54  soft-tissue]
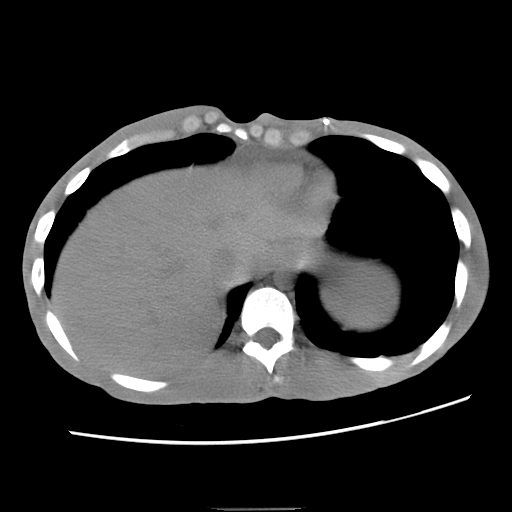
[im 46/54  soft-tissue]
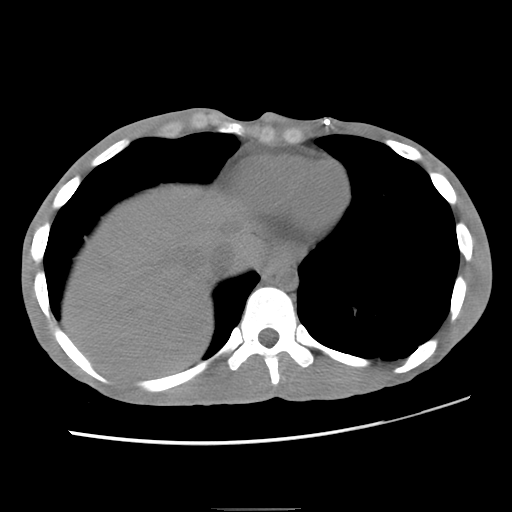
[im 51/54  soft-tissue]
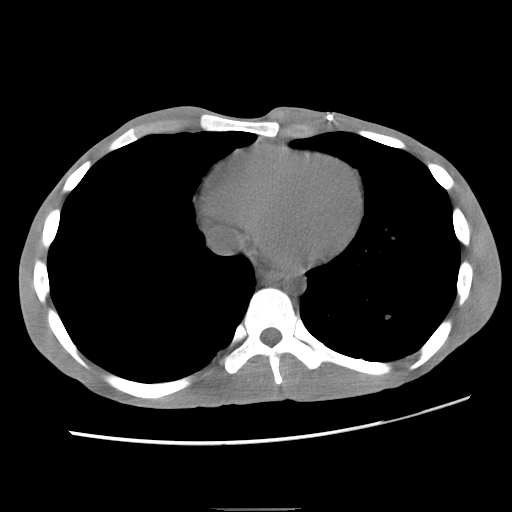

[Series 400: coronal · coronal · 0.64mm/px · 3 of 84 slices shown]
[im 28/84  soft-tissue]
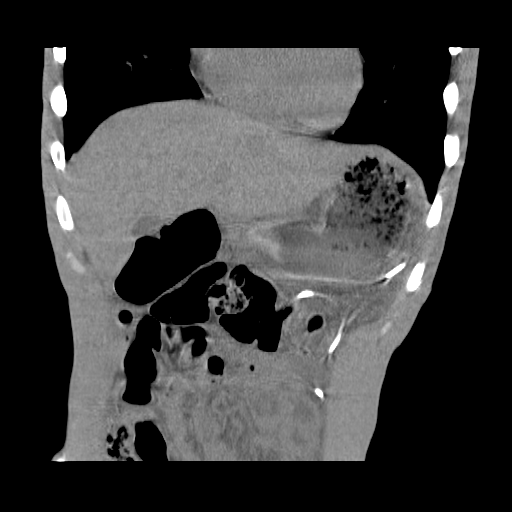
[im 37/84  soft-tissue]
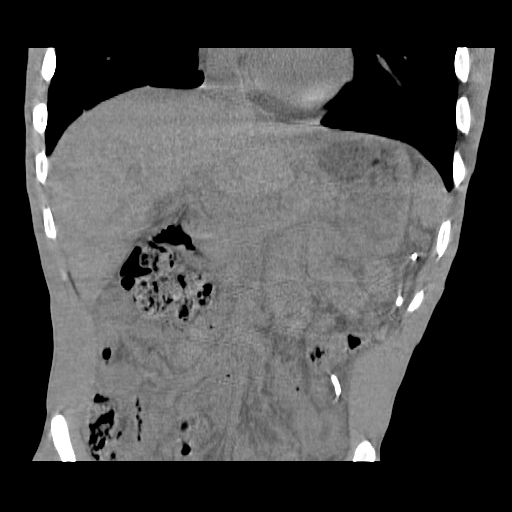
[im 47/84  soft-tissue]
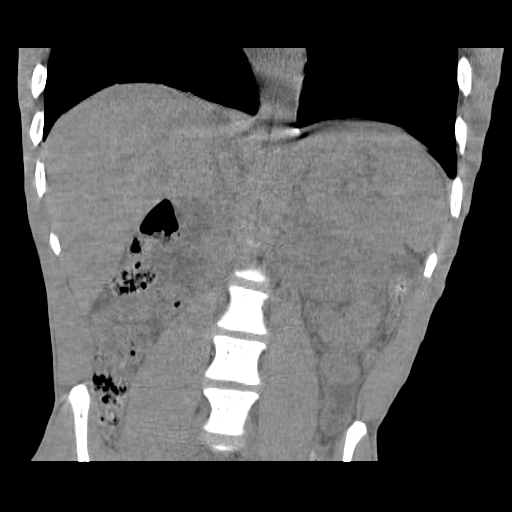

[17 of 46 positions shown; findings below may reference images not displayed]

FINDINGS: Lung bases are clear.  No pleural or pericardial
effusion. No osseous abnormality identified.  Incidental unfused
left L1 transverse process ossification center.

Gas and stool in the visible colon.  Other than the hepatic
flexure, much of the visible colon is decompressed.  No dilated
small bowel loops.  Small volume fluid/debris in the stomach.
Duodenum appears to be decompressed.  Noncontrast liver,
gallbladder, spleen, and pancreas are within normal limits.
Kidneys appear within normal limits, no hydronephrosis is evident.

Left lower anterior chest wall subcutaneous course of the shunt
tubing which enters the abdomen just below the lower ribs to the
left of midline.  Tubing then courses laterally to the left abdomen
before looping back to the midline and ultimately terminate in the
right upper quadrant on series 2 image 34.  There is a small volume
of intra-abdominal fluid density along the course of the shunt,
most apparent along its the left lateral and lower most extent in
the abdomen. There is subtle evidence of fluid in the small bowel
mesentery, more so the left. No fluid along the subcutaneous course
of the shunt.  No fluid collection at the tip of the shunt or
elsewhere.
IMPRESSION: 1.  Shunt tubing looped in the left abdomen and terminating in the
right upper quadrant just across midline.  No fluid collection or
abnormal fluid identified at the tip of the shunt or along its
course.
2.  No acute or inflammatory findings identified in the abdomen.

## 2013-07-08 ENCOUNTER — Telehealth: Payer: Self-pay | Admitting: Family Medicine

## 2013-07-08 MED ORDER — ALPRAZOLAM 0.5 MG PO TABS: 0.5000 mg | ORAL_TABLET | Freq: Every day | ORAL | Status: DC | PRN

## 2013-07-08 NOTE — Telephone Encounter (Signed)
RX called in .

## 2013-07-08 NOTE — Telephone Encounter (Signed)
Ok to refill 

## 2013-07-14 ENCOUNTER — Other Ambulatory Visit: Payer: Self-pay | Admitting: Family Medicine

## 2013-07-29 ENCOUNTER — Other Ambulatory Visit: Payer: Self-pay | Admitting: Family Medicine

## 2013-07-29 NOTE — Telephone Encounter (Signed)
Rx's called in . 

## 2013-07-29 NOTE — Telephone Encounter (Signed)
?   OK to Refill  

## 2013-07-29 NOTE — Telephone Encounter (Signed)
ok 

## 2013-08-04 ENCOUNTER — Other Ambulatory Visit: Payer: Self-pay | Admitting: Family Medicine

## 2013-08-04 NOTE — Telephone Encounter (Signed)
?   OK to Refill  

## 2013-08-05 NOTE — Telephone Encounter (Signed)
Ok with 5 refills 

## 2013-09-15 ENCOUNTER — Other Ambulatory Visit: Payer: Self-pay | Admitting: Family Medicine

## 2013-09-15 NOTE — Telephone Encounter (Signed)
?   OK to Refill  

## 2013-09-16 NOTE — Telephone Encounter (Signed)
RX called in .

## 2013-09-16 NOTE — Telephone Encounter (Signed)
ok 

## 2013-10-20 ENCOUNTER — Other Ambulatory Visit: Payer: Self-pay | Admitting: Family Medicine

## 2013-10-20 NOTE — Telephone Encounter (Signed)
ok 

## 2013-10-20 NOTE — Telephone Encounter (Signed)
?   OK to Refill  

## 2013-10-27 ENCOUNTER — Other Ambulatory Visit: Payer: Self-pay | Admitting: Family Medicine

## 2013-11-12 ENCOUNTER — Other Ambulatory Visit: Payer: Self-pay | Admitting: Family Medicine

## 2013-11-13 NOTE — Telephone Encounter (Signed)
ok 

## 2013-11-13 NOTE — Telephone Encounter (Signed)
?   OK to Refill  

## 2015-07-06 ENCOUNTER — Ambulatory Visit: Payer: Medicaid Other | Admitting: Family Medicine

## 2016-10-06 ENCOUNTER — Ambulatory Visit (INDEPENDENT_AMBULATORY_CARE_PROVIDER_SITE_OTHER): Payer: Medicare HMO | Admitting: Neurology

## 2016-10-06 ENCOUNTER — Encounter: Payer: Self-pay | Admitting: Neurology

## 2016-10-06 VITALS — BP 124/68 | HR 75 | Ht 64.0 in | Wt 122.5 lb

## 2016-10-06 DIAGNOSIS — R4182 Altered mental status, unspecified: Secondary | ICD-10-CM

## 2016-10-06 NOTE — Progress Notes (Signed)
Referring physician: Dr. Harrie Foreman is a 25 y.o. male  History of present illness:  Melvin Moore is a 25 year old black male with a history of mental retardation, autism, and cerebral palsy. The patient has been living in an adult home, he requires supervision. At his usual baseline, he is able to dress himself, he requires assistance with bathing, he can feed himself. He may be minimally verbal, and follow commands. The patient has had an alteration in his mental status since August 2017. The caretakers indicate that the change occurred relatively suddenly. He denied any changes in medication dosing around the time of this change in behavior. He is now not sleeping or eating well, he is losing weight. He will hold food in his mouth, not swallow. The patient at times acts like he does not remember how to feed himself, he seems more confused. The patient may rock or stare off at times. At times, he may begin to act more normally for brief periods of time, then revert back to a less responsive state. The patient has developed some gynecomastia recently as well. He was seen by his primary care physician to check blood work that included a CBC, chemistry profile and liver panel. The CO2 level was slightly low at 19, CBC was relatively unremarkable, hemoglobin was slightly high. A B12 level and thyroid profile was unremarkable. Folate level was normal. The patient is on Depakote, the Depakote level was low, an ammonia level was not checked. Vitamin D level was low, testosterone level was slightly low. The patient has a VP shunt placement, he had a revision 4 or 5 years ago. The last CT scan of the brain in our records was from 2014. He is sent to this office for an evaluation. The patient has not had any falls, he has not had any problems controlling the bowels or the bladder, he has not had any head trauma. The patient does not communicate to others if he is in any pain. He has not had any  nausea or vomiting.  Past Medical History:  Diagnosis Date  . Autistic disorder   . Cerebral palsy (HCC)   . Hyperactive   . Mental retardation   . MR (mental retardation)    "75%" per mother  . Overactive bladder     Past Surgical History:  Procedure Laterality Date  . LAPAROSCOPIC REVISION VENTRICULAR-PERITONEAL (V-P) SHUNT  11/22/2012   Procedure: LAPAROSCOPIC REVISION VENTRICULAR-PERITONEAL (V-P) SHUNT;  Surgeon: Adolph Pollack, MD;  Location: [facility] OR;  Service: General;  Laterality: N/A;  Laparoscopic Repositioning of Ventricular-Peritoneal Shunt  . SHUNT REPLACEMENT    . SHUNT REVISION VENTRICULAR-PERITONEAL  11/22/2012   Procedure: SHUNT REVISION VENTRICULAR-PERITONEAL;  Surgeon: Maeola Harman, MD;  [facility];  Service: Neurosurgery;  Laterality: N/A;  Laparoscopic Repositioning of Ventricular-Peritoneal shunt     Family History  Problem Relation Age of Onset  . Adopted: Yes    Social history:  reports that he has never smoked. He has never used smokeless tobacco. He reports that he does not drink alcohol or use drugs.  Medications:  Prior to Admission medications   Medication Sig Start Date End Date Taking? Authorizing Provider  divalproex (DEPAKOTE ER) 500 MG 24 hr tablet  09/30/16  Yes Historical Provider, MD  mirtazapine (REMERON) 30 MG tablet  09/15/16  Yes Historical Provider, MD  QUEtiapine (SEROQUEL) 100 MG tablet  09/30/16  Yes Historical Provider, MD  QUEtiapine (SEROQUEL) 400 MG tablet  09/30/16  Yes  Historical Provider, MD  traZODone (DESYREL) 50 MG tablet TAKE   1 TABLET BY MOUTH   DAILY AT BEDTIME 08/04/13  Yes Donita BrooksWarren T Pickard, MD     No Known Allergies  ROS:  Out of a complete 14 system review of symptoms, the patient complains only of the following symptoms, and all other reviewed systems are negative.  Weight loss Confusion, difficulty swallowing Anxiety, not enough sleep, decreased appetite, insomnia  Blood pressure 124/68, pulse 75,  weight 122 lb 8 oz (55.6 kg).  Physical Exam  General: The patient is alert and cooperative at the time of the examination.  Eyes: Pupils are equal, round, and reactive to light. Discs are flat bilaterally.  Neck: The neck is supple, no carotid bruits are noted.  Respiratory: The respiratory examination is clear.  Cardiovascular: The cardiovascular examination reveals a regular rate and rhythm, no obvious murmurs or rubs are noted.  Skin: Extremities are without significant edema.  Neurologic Exam  Mental status: The patient is alert, but he is nonverbal, he will follow some commands.  Cranial nerves: Facial symmetry is present. There is good sensation of the face to pinprick and soft touch bilaterally. The strength of the facial muscles and the muscles to head turning and shoulder shrug are normal bilaterally. The patient is nonverbal. The patient will blink to threat bilaterally, he will not track objects.  Motor: The motor testing reveals 5 over 5 strength of all 4 extremities. Good symmetric motor tone is noted throughout.  Sensory: Sensory testing is difficult, but the patient appears responded in a symmetric fashion to deep pain stimulation on all 4 extremities.  Coordination: Cerebellar testing could not be performed, the patient will not follow commands for testing.  Gait and station: Gait is associated with toe walking, the patient can ambulate independently, good stability.  Reflexes: Deep tendon reflexes are symmetric and normal bilaterally. Toes are downgoing bilaterally.   CT head 01/03/13:  IMPRESSION: Overall improved hydrocephalus following shunt revision in December.  Biventricular diameter 47 mm today, as compared with 52 mm previously.  Baseline for this patient is unclear, as there are no previous studies prior to the December 2013 CT scan.  * CT scan images were reviewed online. I agree with the written report.    Assessment/Plan:  1. Cerebral palsy,  autism  2. Altered mental status  The patient will be sent for further blood work today. He is on Depakote, we will check an ammonia level. The patient will be set up for an EEG study, and a CT scan of the brain. The patient has a VP shunt in place, we need to determine whether or not the shunt has malfunctioned. The patient will follow-up in 2 or 3 months. The patient has been on Risperdal in the past, but the caretakers indicate that he has not been on this medication for several years.  Melvin Palau. Keith Breana Litts MD 10/06/2016 10:46 AM  [facility] [address] [place], [place] [id number]  Phone [phone] Fax [phone]

## 2016-10-06 NOTE — Patient Instructions (Signed)
   We will check blood work today and get EEG and a CT of the head.

## 2016-10-11 ENCOUNTER — Telehealth: Payer: Self-pay

## 2016-10-11 LAB — HIV ANTIBODY (ROUTINE TESTING W REFLEX): HIV SCREEN 4TH GENERATION: NONREACTIVE

## 2016-10-11 LAB — AMMONIA: AMMONIA: 56 ug/dL (ref 27–102)

## 2016-10-11 LAB — RPR: RPR: NONREACTIVE

## 2016-10-11 LAB — SEDIMENTATION RATE: Sed Rate: 2 mm/h (ref 0–15)

## 2016-10-11 LAB — VITAMIN B1: THIAMINE: 137.5 nmol/L (ref 66.5–200.0)

## 2016-10-11 NOTE — Telephone Encounter (Signed)
-----   Message from York Spanielharles K Willis, MD sent at 10/11/2016  1:46 PM EDT -----  The blood work results are unremarkable. Please call the patient.  ----- Message ----- From: Nell RangeInterface, [facility] Lab Results In Sent: 10/07/2016   7:41 AM To: York Spanielharles K Willis, MD

## 2016-10-11 NOTE — Telephone Encounter (Signed)
Called w/ unremarkable lab results. May call back w/ additional questions/concerns. 

## 2016-10-18 ENCOUNTER — Telehealth: Payer: Self-pay | Admitting: Neurology

## 2016-10-18 ENCOUNTER — Ambulatory Visit (INDEPENDENT_AMBULATORY_CARE_PROVIDER_SITE_OTHER): Payer: Medicare HMO | Admitting: Neurology

## 2016-10-18 DIAGNOSIS — R4182 Altered mental status, unspecified: Secondary | ICD-10-CM | POA: Diagnosis not present

## 2016-10-18 NOTE — Telephone Encounter (Signed)
I called the caretaker. The EEG was normal.  CT of the head is pending.

## 2016-10-18 NOTE — Procedures (Signed)
    History:  Conley Simmondsathaniel Mcdougall is a 25 year old gentleman with a history of retardation, autism, and cerebral palsy. The patient has had an alteration in his functional level, with decreased ability to cooperate with commands. The patient has not been sleeping or eating well, he is losing weight. The patient is being evaluated for this change in his functional level.  This is a routine EEG. No skull defects are noted. Medications include Depakote, mirtazapine, Seroquel, and trazodone.   EEG classification: Normal awake  Description of the recording: The background rhythms of this recording consists of a fairly well modulated medium amplitude alpha rhythm of 10 Hz that is reactive to eye opening and closure. As the record progresses, the patient appears to remain in the waking state throughout the recording. Photic stimulation was performed, resulting in a bilateral and symmetric photic driving response. Hyperventilation was not performed. At no time during the recording does there appear to be evidence of spike or spike wave discharges or evidence of focal slowing. EKG monitor shows no evidence of cardiac rhythm abnormalities with a heart rate of 72.  Impression: This is a normal EEG recording in the waking state. No evidence of ictal or interictal discharges are seen.

## 2016-10-20 NOTE — Telephone Encounter (Signed)
Pt's mother called for EEG results.

## 2016-10-20 NOTE — Telephone Encounter (Signed)
Returned call and reviewed normal EEG results w/ pt's mother. She verbalized understanding and appreciation for call. Awaiting pre-cert and then can expect call to schedule CT scan.

## 2017-01-10 ENCOUNTER — Encounter: Payer: Self-pay | Admitting: Neurology

## 2017-01-10 ENCOUNTER — Ambulatory Visit (INDEPENDENT_AMBULATORY_CARE_PROVIDER_SITE_OTHER): Payer: Medicare HMO | Admitting: Neurology

## 2017-01-10 VITALS — BP 128/74 | HR 61 | Ht 64.0 in | Wt 125.0 lb

## 2017-01-10 DIAGNOSIS — R4182 Altered mental status, unspecified: Secondary | ICD-10-CM

## 2017-01-10 DIAGNOSIS — F79 Unspecified intellectual disabilities: Secondary | ICD-10-CM

## 2017-01-10 NOTE — Progress Notes (Signed)
Reason for visit: Altered mental status  Melvin Moore is an 26 y.o. male  History of present illness:  Melvin Moore is a 26 year old black male with a history of mental retardation, and an autistic disorder. The patient has had a VP shunt placement. He has had an alteration in his functional level, he has become less verbal, he seems to "space out" and will not respond normally, he has had difficulty with apraxias, he will not remember how to feed himself and hold food in his mouth. The patient has had chronic issues with sleep, he will go several days without sleeping much, and then sleep at an unusual time of day, he never has had a regular diurnal pattern of sleep. The patient has had recent blood work, this was unremarkable, an EEG study was normal. The patient was to be set up for a CT scan of the brain, this has not yet been done. He has recently been taken off of Seroquel within the last several days. He remains on low-dose Depakote and he takes trazodone and Remeron. He returns to this office for an evaluation. The patient does have some mild gait instability, he has not had any falls. The patient has not had any difficulty controlling the bowels or the bladder.  Past Medical History:  Diagnosis Date  . Autistic disorder   . Cerebral palsy (HCC)   . Hyperactive   . MR (mental retardation)    "75%" per mother  . Overactive bladder     Past Surgical History:  Procedure Laterality Date  . LAPAROSCOPIC REVISION VENTRICULAR-PERITONEAL (V-P) SHUNT  11/22/2012   Procedure: LAPAROSCOPIC REVISION VENTRICULAR-PERITONEAL (V-P) SHUNT;  Surgeon: Adolph Pollackodd J Rosenbower, MD;  [facility];  Service: General;  Laterality: N/A;  Laparoscopic Repositioning of Ventricular-Peritoneal Shunt  . SHUNT REPLACEMENT    . SHUNT REVISION VENTRICULAR-PERITONEAL  11/22/2012   Procedure: SHUNT REVISION VENTRICULAR-PERITONEAL;  Surgeon: Maeola HarmanJoseph Stern, MD;  [facility];  Service: Neurosurgery;  Laterality:  N/A;  Laparoscopic Repositioning of Ventricular-Peritoneal shunt     Family History  Problem Relation Age of Onset  . Adopted: Yes    Social history:  reports that he has never smoked. He has never used smokeless tobacco. He reports that he does not drink alcohol or use drugs.   No Known Allergies  Medications:  Prior to Admission medications   Medication Sig Start Date End Date Taking? Authorizing Provider  mirtazapine (REMERON) 30 MG tablet Take 30 mg by mouth at bedtime.  09/15/16  Yes Historical Provider, MD  traZODone (DESYREL) 50 MG tablet TAKE   1 TABLET BY MOUTH   DAILY AT BEDTIME 08/04/13  Yes Donita BrooksWarren T Pickard, MD  divalproex (DEPAKOTE ER) 500 MG 24 hr tablet  09/30/16   Historical Provider, MD  QUEtiapine (SEROQUEL) 100 MG tablet  09/30/16   Historical Provider, MD  QUEtiapine (SEROQUEL) 400 MG tablet  09/30/16   Historical Provider, MD    ROS:  Out of a complete 14 system review of symptoms, the patient complains only of the following symptoms, and all other reviewed systems are negative.  Confusion  Blood pressure 128/74, pulse 61, height 5\' 4"  (1.626 m), weight 125 lb (56.7 kg).  Physical Exam  General: The patient is alert and cooperative at the time of the examination.  Skin: No significant peripheral edema is noted.   Neurologic Exam  Mental status: The patient is alert, he has some difficulty following commands. He does not appear to be agitated.  Cranial nerves: Facial symmetry is present. The patient is nonverbal. He will blink to threat bilaterally.  Motor: The patient has good strength in all 4 extremities.  Sensory examination: The patient appears to respond to pinching sensations all 4 extremities..  Coordination: The patient is unable to follow commands for cerebellar testing.  Gait and station: The patient has a wide-based gait, the patient can walk independently.  Reflexes: Deep tendon reflexes are symmetric.   Assessment/Plan:  1.  History of mental retardation, autism  2. Chronic sleep disturbance  3. History of VP shunt placement  4. Altered mental status  The patient will be set up for a CT scan of the brain to be done. He has recently been taken off of Seroquel, we may consider eliminating the Depakote as well if the CT is unremarkable. The patient will follow-up in 4 months. The etiology of the change in his functional level is not clear.  Marlan Palau MD 01/10/2017 12:43 PM  [facility] [address] [place], [place] [address]  Phone [phone] Fax [phone]

## 2017-01-10 NOTE — Patient Instructions (Signed)
   We will get a CT of the head to evaluate the altered mental status.

## 2017-02-09 ENCOUNTER — Encounter: Payer: Self-pay | Admitting: Neurology

## 2017-02-14 ENCOUNTER — Telehealth: Payer: Self-pay | Admitting: Neurology

## 2017-02-14 NOTE — Telephone Encounter (Signed)
I have received the written results of the CT the head. [facility] was [facility], they had no comparison studies, the patient has had prior CT done through [facility]. This shows moderate to severe ventriculomegaly with a left frontal shunt in place. I will need to get the disc of the CT and compared to what has been done previously.

## 2017-02-15 ENCOUNTER — Telehealth: Payer: Self-pay | Admitting: *Deleted

## 2017-02-15 NOTE — Telephone Encounter (Signed)
Called and spoke with Toni Amendourtney from [facility]. Asked she mail copy of CD of CT head wo completed on 02/09/17 to our office. She verbalized understanding and will get this sent out.

## 2017-03-01 NOTE — Telephone Encounter (Signed)
Spoke with Melvin Moore in medical records. She was able to obtain copy. Gave to you for review, thank you

## 2017-03-01 NOTE — Telephone Encounter (Signed)
I have compared the most recent CT scan of the head done with one done in 2014, the cuts on the scan are slightly different, but I see no definite change in size of the ventricular system.  I called the mother. The patient is still listless, he is off of Depakote and Seroquel. He still is acting the same.  He has had routine blood work, vitamin D levels and thyroid done through his primary doctor, he is on vitamin D supplementation.  The etiology of his behavior changes not clear. An EEG study done was unremarkable.

## 2017-03-01 NOTE — Telephone Encounter (Signed)
I have not received the CD of the CT of the head.

## 2017-06-06 ENCOUNTER — Telehealth: Payer: Self-pay | Admitting: Neurology

## 2017-06-06 ENCOUNTER — Ambulatory Visit: Payer: Medicare HMO | Admitting: Neurology

## 2017-06-06 NOTE — Telephone Encounter (Signed)
This patient did not show for a revisit appointment today. 

## 2017-06-07 ENCOUNTER — Encounter: Payer: Self-pay | Admitting: Neurology

## 2017-12-28 ENCOUNTER — Telehealth: Payer: Self-pay | Admitting: Neurology

## 2017-12-28 ENCOUNTER — Ambulatory Visit: Payer: Medicare HMO | Admitting: Neurology

## 2017-12-28 NOTE — Telephone Encounter (Signed)
This is the second no show for this patient for a RV.

## 2017-12-31 ENCOUNTER — Encounter: Payer: Self-pay | Admitting: Neurology

## 2018-05-22 ENCOUNTER — Encounter: Payer: Self-pay | Admitting: Neurology

## 2018-06-14 ENCOUNTER — Ambulatory Visit: Payer: Medicare HMO | Admitting: Neurology

## 2018-06-14 ENCOUNTER — Encounter

## 2018-10-24 ENCOUNTER — Emergency Department (HOSPITAL_COMMUNITY)
Admission: EM | Admit: 2018-10-24 | Discharge: 2018-10-24 | Disposition: A | Discharge status: Home / Self Care | Payer: Medicare HMO | Attending: Emergency Medicine | Admitting: Emergency Medicine

## 2018-10-24 ENCOUNTER — Encounter (HOSPITAL_COMMUNITY): Payer: Self-pay | Admitting: Emergency Medicine

## 2018-10-24 ENCOUNTER — Institutional Professional Consult (permissible substitution): Payer: Medicare HMO | Admitting: Neurology

## 2018-10-24 ENCOUNTER — Other Ambulatory Visit: Payer: Self-pay

## 2018-10-24 ENCOUNTER — Emergency Department (HOSPITAL_COMMUNITY): Payer: Medicare HMO

## 2018-10-24 ENCOUNTER — Telehealth: Payer: Self-pay | Admitting: *Deleted

## 2018-10-24 DIAGNOSIS — G40909 Epilepsy, unspecified, not intractable, without status epilepticus: Secondary | ICD-10-CM | POA: Insufficient documentation

## 2018-10-24 DIAGNOSIS — R569 Unspecified convulsions: Secondary | ICD-10-CM

## 2018-10-24 DIAGNOSIS — G918 Other hydrocephalus: Secondary | ICD-10-CM

## 2018-10-24 DIAGNOSIS — Z79899 Other long term (current) drug therapy: Secondary | ICD-10-CM | POA: Insufficient documentation

## 2018-10-24 LAB — CBC WITH DIFFERENTIAL/PLATELET
BASOS ABS: 0.1 10*3/uL (ref 0.0–0.1)
Basophils Relative: 1 %
EOS PCT: 1 %
Eosinophils Absolute: 0.1 10*3/uL (ref 0.0–0.5)
HEMATOCRIT: 51.6 % (ref 39.0–52.0)
Hemoglobin: 16.2 g/dL (ref 13.0–17.0)
LYMPHS ABS: 2.6 10*3/uL (ref 0.7–4.0)
LYMPHS PCT: 25 %
MCH: 28.9 pg (ref 26.0–34.0)
MCHC: 31.4 g/dL (ref 30.0–36.0)
MCV: 92.1 fL (ref 80.0–100.0)
Monocytes Absolute: 0.6 10*3/uL (ref 0.1–1.0)
Monocytes Relative: 6 %
NRBC: 0 % (ref 0.0–0.2)
Neutro Abs: 6.8 10*3/uL (ref 1.7–7.7)
Neutrophils Relative %: 67 %
Platelets: ADEQUATE 10*3/uL (ref 150–400)
RBC: 5.6 MIL/uL (ref 4.22–5.81)
RDW: 12.1 % (ref 11.5–15.5)
WBC: 10.2 10*3/uL (ref 4.0–10.5)

## 2018-10-24 LAB — COMPREHENSIVE METABOLIC PANEL
ALK PHOS: 47 U/L (ref 38–126)
ALT: 12 U/L (ref 0–44)
AST: 29 U/L (ref 15–41)
Albumin: 4.8 g/dL (ref 3.5–5.0)
Anion gap: 14 (ref 5–15)
BUN: 11 mg/dL (ref 6–20)
CALCIUM: 10 mg/dL (ref 8.9–10.3)
CO2: 20 mmol/L — ABNORMAL LOW (ref 22–32)
CREATININE: 1.14 mg/dL (ref 0.61–1.24)
Chloride: 103 mmol/L (ref 98–111)
GFR calc Af Amer: >60 mL/min (ref >60)
Glucose, Bld: 155 mg/dL — ABNORMAL HIGH (ref 70–99)
Potassium: 4.7 mmol/L (ref 3.5–5.1)
Sodium: 137 mmol/L (ref 135–145)
Total Bilirubin: 0.9 mg/dL (ref 0.3–1.2)
Total Protein: 7.5 g/dL (ref 6.5–8.1)

## 2018-10-24 LAB — VALPROIC ACID LEVEL

## 2018-10-24 MED ORDER — SODIUM CHLORIDE 0.9 % IV SOLN
INTRAVENOUS | Status: DC
  Administered 2018-10-24: 17:00:00 via INTRAVENOUS

## 2018-10-24 MED ORDER — LEVETIRACETAM 500 MG PO TABS: 500.0000 mg | ORAL_TABLET | Freq: Two times a day (BID) | ORAL | 0 refills | Status: DC

## 2018-10-24 MED ORDER — LEVETIRACETAM IN NACL 1000 MG/100ML IV SOLN
1000.0000 mg | Freq: Once | INTRAVENOUS | Status: AC
  Administered 2018-10-24: 1000 mg via INTRAVENOUS
  Filled 2018-10-24: qty 100

## 2018-10-24 NOTE — Telephone Encounter (Signed)
Pt. had a 4pm  appt. with Dr. Anne HahnWillis for f/u of Cerebral Palsy.  His uncle presented to the office, stated pt. was in the car, ?sz.  I went to the parking lot, to the car, where pt's aunt was standing by the passenger rear door, with it open.  Pt. sitting in the car, seatbelt on.  Emesis, brown and red in color was in his lap and on the seat next to him.  Sister sts. pt. began vomiting and became unresponsive while en route to Dr. Anne HahnWillis' office.  Resp. even, unlabored, skin pale, diaphoretic. He is not verbally responsive but does respond to painful stimuli by raising his head some.  Sister sts. he is normally nonverbal BP 120/68, HR 114. Berneice HeinrichEmma K, RN called 911, and I remained with pt. until they arrived/fim

## 2018-10-24 NOTE — Discharge Instructions (Signed)
Call the neurosurgeon tomorrow as you have been instructed.

## 2018-10-24 NOTE — ED Provider Notes (Signed)
[facility] Provider Note   CSN: [id number] Arrival date & time: 10/24/18  1616     History   Chief Complaint Chief Complaint  Patient presents with  . Seizures    HPI Galen Daftathaniel R Deasis is a 27 y.o. male.  27 year old male with history of cerebral palsy as well as mental retardation presents after having a witnessed seizure just prior to arrival.  Has no prior history of seizure disorder and had episode of 3 minutes of generalized tonic-clonic activity.  Patient postictal afterwards.  Possibly bit his tongue.  CBG was 120 per EMS.  Patient is slowly return to his baseline.  His sister states no recent history of illness.  Notes that he has had somewhat decreased oral intake however.  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     Past Medical History:  Diagnosis Date  . Autistic disorder   . Cerebral palsy (HCC)   . Hyperactive   . MR (mental retardation)    "75%" per mother  . Overactive bladder     Patient Active Problem List   Diagnosis Date Noted  . Altered mental status 10/06/2016  . Cerebral palsy (HCC)   . Mental retardation   . Autistic disorder   . Nausea with vomiting 11/12/2012    Past Surgical History:  Procedure Laterality Date  . LAPAROSCOPIC REVISION VENTRICULAR-PERITONEAL (V-P) SHUNT  11/22/2012   Procedure: LAPAROSCOPIC REVISION VENTRICULAR-PERITONEAL (V-P) SHUNT;  Surgeon: Adolph Pollackodd J Rosenbower, MD;  [facility];  Service: General;  Laterality: N/A;  Laparoscopic Repositioning of Ventricular-Peritoneal Shunt  . SHUNT REPLACEMENT    . SHUNT REVISION VENTRICULAR-PERITONEAL  11/22/2012   Procedure: SHUNT REVISION VENTRICULAR-PERITONEAL;  Surgeon: Maeola HarmanJoseph Stern, MD;  [facility];  Service: Neurosurgery;  Laterality: N/A;  Laparoscopic Repositioning of Ventricular-Peritoneal shunt         Home Medications    Prior to Admission medications   Medication Sig Start Date End Date Taking? Authorizing Provider    divalproex (DEPAKOTE ER) 500 MG 24 hr tablet  09/30/16   [provider]  mirtazapine (REMERON) 30 MG tablet Take 30 mg by mouth at bedtime.  09/15/16   [provider]  QUEtiapine (SEROQUEL) 100 MG tablet  09/30/16   [provider]  QUEtiapine (SEROQUEL) 400 MG tablet  09/30/16   [provider]  traZODone (DESYREL) 50 MG tablet TAKE   1 TABLET BY MOUTH   DAILY AT BEDTIME 08/04/13   Donita BrooksPickard, Warren T, MD    Family History Family History  Adopted: Yes    Social History Social History   Tobacco Use  . Smoking status: Never Smoker  . Smokeless tobacco: Never Used  Substance Use Topics  . Alcohol use: No  . Drug use: No     Allergies   Patient has no known allergies.   Review of Systems Review of Systems  Unable to perform ROS: Mental status change     Physical Exam Updated Vital Signs There were no vitals taken for this visit.  Physical Exam  Constitutional: He appears well-developed and well-nourished. He appears lethargic.  Non-toxic appearance. No distress.  HENT:  Head: Normocephalic and atraumatic.  Eyes: Pupils are equal, round, and reactive to light. Conjunctivae, EOM and lids are normal.  Neck: Normal range of motion. Neck supple. No tracheal deviation present. No thyroid mass present.  Cardiovascular: Normal rate, regular rhythm and normal heart sounds. Exam reveals no gallop.  No murmur heard. Pulmonary/Chest: Effort normal  and breath sounds normal. No stridor. No respiratory distress. He has no decreased breath sounds. He has no wheezes. He has no rhonchi. He has no rales.  Abdominal: Soft. Normal appearance and bowel sounds are normal. He exhibits no distension. There is no tenderness. There is no rebound and no CVA tenderness.  Musculoskeletal: Normal range of motion. He exhibits no edema or tenderness.  Neurological: He appears lethargic. He displays no tremor. No cranial nerve deficit or sensory deficit. He displays no  seizure activity. GCS eye subscore is 3. GCS verbal subscore is 2. GCS motor subscore is 5.  Patient postictal currently  Skin: Skin is warm and dry. No abrasion and no rash noted.  Psychiatric: He is inattentive.  Nursing note and vitals reviewed.    ED Treatments / Results  Labs (all labs ordered are listed, but only abnormal results are displayed) Labs Reviewed  CBC WITH DIFFERENTIAL/PLATELET  COMPREHENSIVE METABOLIC PANEL  VALPROIC ACID LEVEL    EKG None  Radiology No results found.  Procedures Procedures (including critical care time)  Medications Ordered in ED Medications  0.9 %  sodium chloride infusion (has no administration in time range)     Initial Impression / Assessment and Plan / ED Course  I have reviewed the triage vital signs and the nursing notes.  Pertinent labs & imaging results that were available during my care of the patient were reviewed by me and considered in my medical decision making (see chart for details).     Patient's head CT does show worsening hydrocephalus.  Has been seen by neurosurgery and outpatient follow-up has been scheduled.  No further seizure activity noted here.  Patient loaded with Keppra and outpatient follow-up arranged She has returned to his baseline at time of discharge  CRITICAL CARE Performed by: Toy Baker Total critical care time: 55 minutes Critical care time was exclusive of separately billable procedures and treating other patients. Critical care was necessary to treat or prevent imminent or life-threatening deterioration. Critical care was time spent personally by me on the following activities: development of treatment plan with patient and/or surrogate as well as nursing, discussions with consultants, evaluation of patient's response to treatment, examination of patient, obtaining history from patient or surrogate, ordering and performing treatments and interventions, ordering and review of laboratory  studies, ordering and review of radiographic studies, pulse oximetry and re-evaluation of patient's condition.   Final Clinical Impressions(s) / ED Diagnoses   Final diagnoses:  None    ED Discharge Orders    None       Lorre Nick, MD 10/24/18 2144

## 2018-10-24 NOTE — ED Triage Notes (Addendum)
Pt BIB [facility], family reports pt had a seizure lasting 3 minutes with full body shaking. Pt had an episode of vomiting, probable oral trauma. No known seizure hx, hx cerebral palsy and down syndrome. EMS vitals: BP 98/70, HR 118, SpO2 96% room air, CBG 120. Pt back to baseline at this time.

## 2018-10-24 NOTE — ED Notes (Signed)
Patient caregiver verbalizes understanding of discharge instructions. Opportunity for questioning and answers were provided. Armband removed by staff, pt discharged from ED ambulatory w/ two caregivers.

## 2018-10-24 NOTE — Consult Note (Signed)
Chief Complaint   Chief Complaint  Patient presents with  . Seizures    HPI   Consult requested by: Dr Zenia Resides Reason for consult: Seizure, hydrocephalus  HPI: Melvin Moore is a 27 y.o. male with past medical history significant for CP, autism and mental retardation who presents to ER with new onset seizure. Family at bedside giving history. He was on his way to Neurologist for routine follow up when he had a roughly 3 minute seizure. He was immediately post ictal, but has since returned to baseline. At baseline he is nonverbal.   He has a history of hydrocephalus s/p VP shunt placement and revision x1. Last VP shunt surgery 2013 by Dr Vertell Limber. No issues since surgery. For years, he has periods of lethargy but this is typically after not sleeping for 3-4 days. They have not noticed any abnormal fatigue or drastic change in behavior.   Patient Active Problem List   Diagnosis Date Noted  . Altered mental status 10/06/2016  . Cerebral palsy ([place])   . Mental retardation   . Autistic disorder   . Nausea with vomiting 11/12/2012    PMH: Past Medical History:  Diagnosis Date  . Autistic disorder   . Cerebral palsy ([place])   . Hyperactive   . MR (mental retardation)    "75%" per mother  . Overactive bladder     PSH: Past Surgical History:  Procedure Laterality Date  . LAPAROSCOPIC REVISION VENTRICULAR-PERITONEAL (V-P) SHUNT  11/22/2012   Procedure: LAPAROSCOPIC REVISION VENTRICULAR-PERITONEAL (V-P) SHUNT;  Surgeon: Odis Hollingshead, MD;  Location: [facility];  Service: General;  Laterality: N/A;  Laparoscopic Repositioning of Ventricular-Peritoneal Shunt  . SHUNT REPLACEMENT    . SHUNT REVISION VENTRICULAR-PERITONEAL  11/22/2012   Procedure: SHUNT REVISION VENTRICULAR-PERITONEAL;  Surgeon: Erline Levine, MD;  Location: [place];  Service: Neurosurgery;  Laterality: N/A;  Laparoscopic Repositioning of Ventricular-Peritoneal shunt      (Not in a hospital admission)  SH: Social  History   Tobacco Use  . Smoking status: Never Smoker  . Smokeless tobacco: Never Used  Substance Use Topics  . Alcohol use: No  . Drug use: No    MEDS: Prior to Admission medications   Medication Sig Start Date End Date Taking? Authorizing Provider  divalproex (DEPAKOTE ER) 500 MG 24 hr tablet Take 500 mg by mouth every evening.  09/30/16  Yes [provider]  mirtazapine (REMERON) 30 MG tablet Take 30 mg by mouth at bedtime.  09/15/16  Yes [provider]  Vitamin D, Ergocalciferol, (DRISDOL) 1.25 MG (50000 UT) CAPS capsule Take 50,000 Units by mouth every 7 (seven) days.   Yes [provider]  traZODone (DESYREL) 50 MG tablet TAKE   1 TABLET BY MOUTH   DAILY AT BEDTIME Patient not taking: Reported on 10/24/2018 08/04/13   Susy Frizzle, MD    ALLERGY: No Known Allergies  Social History   Tobacco Use  . Smoking status: Never Smoker  . Smokeless tobacco: Never Used  Substance Use Topics  . Alcohol use: No     Family History  Adopted: Yes     ROS   ROS uanble to obtain, nonverbal at baseline  Exam   Vitals:   10/24/18 1845 10/24/18 1900  BP: 112/60 (!) 113/59  Pulse: (!) 101 (!) 106  Resp: (!) 23 (!) 21  Temp:    SpO2: 98% 97%   General appearance: young male, resting comfortably NAD Eyes: No scleral injection Cardiovascular: Regular rate and  rhythm without murmurs, rubs, gallops. No edema or variciosities. Distal pulses normal. Pulmonary: Effort normal, non-labored breathing  Neurological Mental Status:    - Patient is awake, eyes open staring off     - Does not follow commands  Cranial Nerves: limited but grossly intact Sensory: Sensation grossly intact to LT W/D extremities to painful stimulus  Results - Imaging/Labs   Results for orders placed or performed during the hospital encounter of 10/24/18 (from the past 48 hour(s))  CBC with Differential/Platelet     Status: None   Collection Time: 10/24/18  4:38 PM  Result  Value Ref Range   WBC 10.2 4.0 - 10.5 K/uL    Comment: REPEATED TO VERIFY WHITE COUNT CONFIRMED ON SMEAR    RBC 5.60 4.22 - 5.81 MIL/uL   Hemoglobin 16.2 13.0 - 17.0 g/dL   HCT 51.6 39.0 - 52.0 %   MCV 92.1 80.0 - 100.0 fL   MCH 28.9 26.0 - 34.0 pg   MCHC 31.4 30.0 - 36.0 g/dL   RDW 12.1 11.5 - 15.5 %   Platelets  150 - 400 K/uL    PLATELET CLUMPS NOTED ON SMEAR, COUNT APPEARS ADEQUATE    Comment: REPEATED TO VERIFY   nRBC 0.0 0.0 - 0.2 %   Neutrophils Relative % 67 %   Neutro Abs 6.8 1.7 - 7.7 K/uL   Lymphocytes Relative 25 %   Lymphs Abs 2.6 0.7 - 4.0 K/uL   Monocytes Relative 6 %   Monocytes Absolute 0.6 0.1 - 1.0 K/uL   Eosinophils Relative 1 %   Eosinophils Absolute 0.1 0.0 - 0.5 K/uL   Basophils Relative 1 %   Basophils Absolute 0.1 0.0 - 0.1 K/uL   Burr Cells PRESENT     Comment: Performed at [facility], [address] [place], [place] [address]  Comprehensive metabolic panel     Status: Abnormal   Collection Time: 10/24/18  4:38 PM  Result Value Ref Range   Sodium 137 135 - 145 mmol/L   Potassium 4.7 3.5 - 5.1 mmol/L    Comment: HEMOLYSIS AT THIS LEVEL MAY AFFECT RESULT   Chloride 103 98 - 111 mmol/L   CO2 20 (L) 22 - 32 mmol/L   Glucose, Bld 155 (H) 70 - 99 mg/dL   BUN 11 6 - 20 mg/dL   Creatinine, Ser 1.14 0.61 - 1.24 mg/dL   Calcium 10.0 8.9 - 10.3 mg/dL   Total Protein 7.5 6.5 - 8.1 g/dL   Albumin 4.8 3.5 - 5.0 g/dL   AST 29 15 - 41 U/L   ALT 12 0 - 44 U/L   Alkaline Phosphatase 47 38 - 126 U/L   Total Bilirubin 0.9 0.3 - 1.2 mg/dL   GFR calc non Af Amer >60 >60 mL/min   GFR calc Af Amer >60 >60 mL/min    Comment: (NOTE) The eGFR has been calculated using the CKD EPI equation. This calculation has not been validated in all clinical situations. eGFR's persistently <60 mL/min signify possible Chronic Kidney Disease.    Anion gap 14 5 - 15    Comment: Performed at [facility] [address] [place], [place] [address]  Valproic acid  level     Status: Abnormal   Collection Time: 10/24/18  6:31 PM  Result Value Ref Range   Valproic Acid Lvl <10 (L) 50.0 - 100.0 ug/mL    Comment: RESULTS CONFIRMED BY MANUAL DILUTION Performed at [facility]  [address] [place], [place] [address]     Ct Head Wo Contrast  Result Date: 10/24/2018 CLINICAL DATA:  27 y/o  M; Seizure, new, nontraumatic, 18-40 yrs EXAM: CT HEAD WITHOUT CONTRAST TECHNIQUE: Contiguous axial images were obtained from the base of the skull through the vertex without intravenous contrast. COMPARISON:  01/03/2013 CT of the head. FINDINGS: Brain: Interval increase in hydrocephalus, for example the frontal horns measure 56 mm medial-lateral, previously 47 mm, the third ventricle measures 18 mm medial-lateral, previously 16 mm. Diffuse sulcal effacement. No herniation. Stable position of left frontal approach ventriculostomy catheter with tip abutting the septum pellucidum. Within the field of view ventriculostomy tubing is intact without discontinuity. No stroke, hemorrhage, or focal mass effect. Vascular: No hyperdense vessel or unexpected calcification. Skull: Stable chronic postsurgical changes related to left frontal burr hole. Sinuses/Orbits: Small mucous retention cysts within the maxillary sinuses. Additional visible paranasal sinuses and the mastoid air cells are normally aerated. Other: None. IMPRESSION: 1. Interval increase in hydrocephalus from 2014. Diffuse sulcal effacement. No herniation. 2. No stroke, hemorrhage, or focal mass effect identified. 3. Stable position of left frontal approach ventriculostomy catheter. Electronically Signed   By: Kristine Garbe M.D.   On: 10/24/2018 17:04    Impression/Plan   27 y.o. male s/p VP shunt placement with new onset seizures in the setting of worsening hydrocephalus. He is back at neurologic baseline per family. He will likely need his shunt adjusted. Because he is at neurologic baseline, believe this  can be addressed on a non-emergent, outpt basis. Family at bedside is agreeable to plan and will call tomorrow for appt with Dr Vertell Limber who performed previous surgery. Strong return precautions discussed. - Loaded with 1g Keppra. Continue Keppra 511m BID

## 2018-10-24 NOTE — ED Notes (Signed)
Pt's rectal temp was 98.9.

## 2018-11-06 ENCOUNTER — Telehealth: Payer: Self-pay | Admitting: Neurology

## 2018-11-06 ENCOUNTER — Ambulatory Visit: Payer: Self-pay | Admitting: Neurology

## 2018-11-06 NOTE — Telephone Encounter (Signed)
This patient did not show for a revisit appointment today.  He also no showed on 28 December 2017, his most recent no-show in November 14 was because he went to the emergency room with a seizure.  His most recent Depakote level in the ER on 24 October 2018 was less than 10.

## 2018-11-12 ENCOUNTER — Encounter: Payer: Self-pay | Admitting: Neurology

## 2018-12-06 ENCOUNTER — Ambulatory Visit (HOSPITAL_COMMUNITY)
Admission: EM | Admit: 2018-12-06 | Discharge: 2018-12-06 | Disposition: A | Discharge status: Home / Self Care | Payer: Medicare HMO | Attending: Family Medicine | Admitting: Family Medicine

## 2018-12-06 ENCOUNTER — Encounter (HOSPITAL_COMMUNITY): Payer: Self-pay | Admitting: Emergency Medicine

## 2018-12-06 DIAGNOSIS — G40909 Epilepsy, unspecified, not intractable, without status epilepticus: Secondary | ICD-10-CM | POA: Diagnosis not present

## 2018-12-06 HISTORY — DX: Unspecified convulsions: R56.9

## 2018-12-06 MED ORDER — LEVETIRACETAM 500 MG PO TABS: 500.0000 mg | ORAL_TABLET | Freq: Two times a day (BID) | ORAL | 1 refills | Status: DC

## 2018-12-06 NOTE — Discharge Instructions (Signed)
Keppra is refilled Continue giving 2 x a day See Dr Anne HahnWillis in Feb. As scheduled

## 2018-12-06 NOTE — ED Triage Notes (Signed)
Pt here for refill on keppra; per family pt takes for seizure

## 2018-12-06 NOTE — ED Provider Notes (Signed)
[facility]    CSN: [id number] Arrival date & time: 12/06/18  1203     History   Chief Complaint Chief Complaint  Patient presents with  . Medication Refill    HPI Melvin Moore is a 27 y.o. male.   HPI  27 year old man here with his family for refill of his prescription for Keppra.  Recently diagnosed with a seizure disorder in November.  They missed follow-up with Dr. Anne HahnWillis, and are out of their Keppra.  He has not had any for 2 days.  He is here with his sister.  She states she takes him to all of his appointments.  His mother has passed away.  He has not had recent seizure of any consequence. He does have a family practice doctor.  He sees him regularly.  Next appointment is January 9 He has an appointment with Dr. Anne HahnWillis for February 12.  Family is reminded of this appointment, and I gave them an appointment reminder with the date and time highlighted.  They have missed several appointments and risk being discharged from the practice if I do not make this appointment.  I did not remind him of this fact, but did emphasize to them the importance of keeping the appointment or calling if they are unable to go.  Past Medical History:  Diagnosis Date  . Autistic disorder   . Cerebral palsy (HCC)   . Hyperactive   . MR (mental retardation)    "75%" per mother  . Overactive bladder   . Seizures [facility](HCC)     Patient Active Problem List   Diagnosis Date Noted  . Altered mental status 10/06/2016  . Cerebral palsy (HCC)   . Mental retardation   . Autistic disorder   . Nausea with vomiting 11/12/2012    Past Surgical History:  Procedure Laterality Date  . LAPAROSCOPIC REVISION VENTRICULAR-PERITONEAL (V-P) SHUNT  11/22/2012   Procedure: LAPAROSCOPIC REVISION VENTRICULAR-PERITONEAL (V-P) SHUNT;  Surgeon: Adolph Pollackodd J Rosenbower, MD;  [facility];  Service: General;  Laterality: N/A;  Laparoscopic Repositioning of Ventricular-Peritoneal Shunt  . SHUNT REPLACEMENT      . SHUNT REVISION VENTRICULAR-PERITONEAL  11/22/2012   Procedure: SHUNT REVISION VENTRICULAR-PERITONEAL;  Surgeon: Maeola HarmanJoseph Stern, MD;  [facility];  Service: Neurosurgery;  Laterality: N/A;  Laparoscopic Repositioning of Ventricular-Peritoneal shunt        Home Medications    Prior to Admission medications   Medication Sig Start Date End Date Taking? Authorizing Provider  divalproex (DEPAKOTE ER) 500 MG 24 hr tablet Take 500 mg by mouth every evening.  09/30/16   [provider]  levETIRAcetam (KEPPRA) 500 MG tablet Take 1 tablet (500 mg total) by mouth 2 (two) times daily. 12/06/18   Eustace MooreNelson, Naje Rice Sue, MD  mirtazapine (REMERON) 30 MG tablet Take 30 mg by mouth at bedtime.  09/15/16   [provider]  traZODone (DESYREL) 50 MG tablet TAKE   1 TABLET BY MOUTH   DAILY AT BEDTIME Patient not taking: Reported on 10/24/2018 08/04/13   Donita BrooksPickard, Warren T, MD  Vitamin D, Ergocalciferol, (DRISDOL) 1.25 MG (50000 UT) CAPS capsule Take 50,000 Units by mouth every 7 (seven) days.    [provider]    Family History Family History  Adopted: Yes    Social History Social History   Tobacco Use  . Smoking status: Never Smoker  . Smokeless tobacco: Never Used  Substance Use Topics  . Alcohol use: No  . Drug use: No  Allergies   Patient has no known allergies.   Review of Systems Review of Systems  Unable to perform ROS: Patient unresponsive  Review of systems is discussed with sister.  No trouble with bowels or digestion.  No breakdown of skin.  No recent cough or cold symptoms, no infection.  She believes immunizations are up-to-date.  No behavior problems.  He does have a longstanding sleep disorder, and is up at all times at night, naps during the day.  No acute symptoms to report   Physical Exam Triage Vital Signs ED Triage Vitals [12/06/18 1337]  Enc Vitals Group     BP 136/84     Pulse Rate 82     Resp 18     Temp 99.3 F (37.4 C)     Temp  Source Temporal     SpO2 96 %   No data found.  Updated Vital Signs BP 136/84 (BP Location: Left Arm)   Pulse 82   Temp 99.3 F (37.4 C) (Temporal)   Resp 18   SpO2 96%       Physical Exam Constitutional:      General: He is not in acute distress.    Appearance: He is normal weight.     Comments: Nonverbal.  Avoids eye contact.  Drooling.  HENT:     Head: Normocephalic and atraumatic.     Mouth/Throat:     Mouth: Mucous membranes are moist.  Neck:     Comments: Moves neck fully Cardiovascular:     Rate and Rhythm: Normal rate and regular rhythm.     Heart sounds: Normal heart sounds.  Pulmonary:     Effort: Pulmonary effort is normal.     Breath sounds: Normal breath sounds.  Abdominal:     Comments: Examined in chair.  Bowel sounds are active  Musculoskeletal: Normal range of motion.     Comments: Ambulates with small steps, guided by family.  Moves all 4 extremities well  Skin:    General: Skin is warm and dry.     Coloration: Skin is not pale.     Findings: No rash.  Neurological:     General: No focal deficit present.     Mental Status: Mental status is at baseline.  Psychiatric:        Behavior: Behavior normal.     Comments: Quiet.  Cooperative      UC Treatments / Results  Labs (all labs ordered are listed, but only abnormal results are displayed) Labs Reviewed - No data to display  EKG None  Radiology No results found.  Procedures Procedures (including critical care time)  Medications Ordered in UC Medications - No data to display  Initial Impression / Assessment and Plan / UC Course  I have reviewed the triage vital signs and the nursing notes.  Pertinent labs & imaging results that were available during my care of the patient were reviewed by me and considered in my medical decision making (see chart for details).     Follow-up with Dr. Anne HahnWillis is confirmed in medical record.  Family is reminded of the importance of keeping the  appointment.  Medicine is refilled until end of February to allow them time to get to their appointment.  Importance of staying on medication and not running out is emphasized to family. Final Clinical Impressions(s) / UC Diagnoses   Final diagnoses:  Seizure disorder [facility](HCC)     Discharge Instructions     Keppra is refilled Continue giving 2  x a day See Dr Anne Hahn in Feb. As scheduled   ED Prescriptions    Medication Sig Dispense Auth. Provider   levETIRAcetam (KEPPRA) 500 MG tablet Take 1 tablet (500 mg total) by mouth 2 (two) times daily. 60 tablet Eustace Moore, MD     Controlled Substance Prescriptions [place] Controlled Substance Registry consulted? Not Applicable   Eustace Moore, MD 12/06/18 [phone]

## 2018-12-20 ENCOUNTER — Encounter: Payer: Self-pay | Admitting: Neurology

## 2018-12-20 ENCOUNTER — Telehealth: Payer: Self-pay | Admitting: Neurology

## 2018-12-20 ENCOUNTER — Ambulatory Visit (INDEPENDENT_AMBULATORY_CARE_PROVIDER_SITE_OTHER): Payer: Medicare HMO | Admitting: Neurology

## 2018-12-20 VITALS — BP 133/88 | HR 72 | Ht 64.0 in | Wt 123.0 lb

## 2018-12-20 DIAGNOSIS — G808 Other cerebral palsy: Secondary | ICD-10-CM

## 2018-12-20 MED ORDER — LEVETIRACETAM 100 MG/ML PO SOLN: 500.0000 mg | Freq: Two times a day (BID) | ORAL | 1 refills | Status: DC

## 2018-12-20 MED ORDER — DIVALPROEX SODIUM 125 MG PO DR TAB: 250.0000 mg | DELAYED_RELEASE_TABLET | Freq: Two times a day (BID) | ORAL | 1 refills | Status: DC

## 2018-12-20 NOTE — Telephone Encounter (Signed)
Pt sister(not on DPR-Etheridge,Sherial And James)has called, it has been explained during the call that because there is no DPR that a call back to her can not be a guarantee.  Pt sister  States she wanted to try and get pt in earlier than Feb because of not being able to get pt to open his  Mouth to take his medication.  Sister was informed that Dr Anne Hahn does not have anything before July.  Pt Feb appointment is on wait list.  Pt sister has been advised to contact ED based on having difficulty in getting pt to open his mouth to take his levETIRAcetam (KEPPRA) 500 MG tablet.

## 2018-12-20 NOTE — Telephone Encounter (Signed)
I spoke with Dr. Anne Hahn. He recommends that pt come in today at 12 noon for an appt. I called pt's sister back, offered her that appt, she accepted and will bring to this appt today.

## 2018-12-20 NOTE — Progress Notes (Signed)
Reason for visit: Behavioral changes, history of seizures  Melvin Moore is an 28 y.o. male  History of present illness:  Melvin Moore is a 28 year old autistic black male who is brought in here for evaluation urgently for evaluation of a change in eating and drinking.  Throughout his life, he has never done well with eating and drinking, family members would have to be stern with him in order to get him to eat or drink.  Over the last 3 years, the patient has fallen into a pattern where he may go up to 4 or 5 days without eating or drinking and then all of a sudden he will begin consuming a large amount of food and fluids for a period of time only to cycle back into a timeframe where he does not eat or drink well.  The patient will not even swallow his own saliva during these episodes.  The patient has not been losing weight.  He has gone 3 days without eating or drinking, he is brought into the office today.  The patient otherwise remains alert, he will follow commands, he is nonverbal.  The patient has been placed on Keppra recently following a seizure on 14 November, the patient had a recurring seizure on 06 December 2018.  A CT scan of the brain done on 24 October 2018 did not show any acute changes.  There is some interval increase in hydrocephalus since 2014.  The patient apparently was seen through neurosurgery recently, it was felt that the shunt was working properly.  Past Medical History:  Diagnosis Date  . Autistic disorder   . Cerebral palsy (HCC)   . Hyperactive   . MR (mental retardation)    "75%" per mother  . Overactive bladder   . Seizures (HCC)     Past Surgical History:  Procedure Laterality Date  . LAPAROSCOPIC REVISION VENTRICULAR-PERITONEAL (V-P) SHUNT  11/22/2012   Procedure: LAPAROSCOPIC REVISION VENTRICULAR-PERITONEAL (V-P) SHUNT;  Surgeon: Adolph Pollack, MD;  Location: [facility] OR;  Service: General;  Laterality: N/A;  Laparoscopic Repositioning of  Ventricular-Peritoneal Shunt  . SHUNT REPLACEMENT    . SHUNT REVISION VENTRICULAR-PERITONEAL  11/22/2012   Procedure: SHUNT REVISION VENTRICULAR-PERITONEAL;  Surgeon: Maeola Harman, MD;  Location: [facility];  Service: Neurosurgery;  Laterality: N/A;  Laparoscopic Repositioning of Ventricular-Peritoneal shunt     Family History  Adopted: Yes    Social history:  reports that he has never smoked. He has never used smokeless tobacco. He reports that he does not drink alcohol or use drugs.   No Known Allergies  Medications:  Prior to Admission medications   Medication Sig Start Date End Date Taking? Authorizing Provider  mirtazapine (REMERON) 30 MG tablet Take 30 mg by mouth at bedtime.  09/15/16  Yes [provider]  Vitamin D, Ergocalciferol, (DRISDOL) 1.25 MG (50000 UT) CAPS capsule Take 50,000 Units by mouth every 7 (seven) days.   Yes [provider]  divalproex (DEPAKOTE) 125 MG DR tablet Take 2 tablets (250 mg total) by mouth 2 (two) times daily. 12/20/18   York Spaniel, MD  levETIRAcetam (KEPPRA) 100 MG/ML solution Take 5 mLs (500 mg total) by mouth 2 (two) times daily. 12/20/18   York Spaniel, MD    ROS:  Out of a complete 14 system review of symptoms, the patient complains only of the following symptoms, and all other reviewed systems are negative.  Seizures Decreased appetite  Blood pressure 133/88, pulse 72, height 5\' 4"  (  1.626 m), weight 123 lb (55.8 kg).  Physical Exam  General: The patient is alert and cooperative at the time of the examination.  Skin: No significant peripheral edema is noted.   Neurologic Exam  Mental status: The patient is alert, he will follow some verbal commands.  The patient is nonverbal.   Cranial nerves: Facial symmetry is present.  The patient will blink to threat bilaterally.  He is nonverbal.  Motor: The patient has good strength in all 4 extremities.  Sensory examination: The patient responds to pain  stimulation on all fours.  Coordination: The patient will not follow commands well enough to perform cerebellar testing.  Gait and station: The patient has a slightly wide-based gait, the patient can walk independently.  Reflexes: Deep tendon reflexes are symmetric.   Assessment/Plan:  1.  Autism, nonverbal state  2.  Seizures  3.  Altered behavior, decreased oral intake of food and fluids  The patient has a longstanding history of refusal to eat or drink for several days at a time only to cycle into a timeframe where he consumes large amounts of food.  If the patient goes greater than 5 days without any fluids, he will need to go to the emergency room, the patient eventually may require a feeding tube placement.  The patient will follow-up otherwise in about 4 months.  A prescription was given for the Depakote sprinkles 125 mg to take 2 twice daily, these can be placed in the mouth and dissolved.  The patient was given a liquid preparation of the Keppra.   Marlan Palau MD 12/20/2018 1:35 PM  [facility] [address] [place], [place] [address]  Phone [phone] Fax [phone]

## 2019-01-22 ENCOUNTER — Encounter

## 2019-01-22 ENCOUNTER — Ambulatory Visit: Payer: Medicare HMO | Admitting: Neurology

## 2019-02-27 ENCOUNTER — Other Ambulatory Visit: Payer: Self-pay | Admitting: Neurology

## 2019-04-15 ENCOUNTER — Telehealth: Payer: Self-pay

## 2019-04-15 NOTE — Telephone Encounter (Signed)
I contacted the pt's mom and was able to update Allergies meds and pmh for visit scheduled for 04/16/2019.

## 2019-04-16 ENCOUNTER — Encounter: Payer: Self-pay | Admitting: Neurology

## 2019-04-16 ENCOUNTER — Other Ambulatory Visit: Payer: Self-pay

## 2019-04-16 ENCOUNTER — Ambulatory Visit (INDEPENDENT_AMBULATORY_CARE_PROVIDER_SITE_OTHER): Payer: Medicare HMO | Admitting: Neurology

## 2019-04-16 DIAGNOSIS — G808 Other cerebral palsy: Secondary | ICD-10-CM

## 2019-04-16 DIAGNOSIS — R569 Unspecified convulsions: Secondary | ICD-10-CM | POA: Diagnosis not present

## 2019-04-16 MED ORDER — LEVETIRACETAM 100 MG/ML PO SOLN: ORAL | 1 refills | Status: DC

## 2019-04-16 MED ORDER — DIVALPROEX SODIUM 125 MG PO DR TAB: 250.0000 mg | DELAYED_RELEASE_TABLET | Freq: Two times a day (BID) | ORAL | 1 refills | Status: DC

## 2019-04-16 NOTE — Progress Notes (Signed)
     Virtual Visit via Video Note  I connected with Melvin Moore on 04/16/19 at  3:00 PM EDT by a video enabled telemedicine application and verified that I am speaking with the correct person using two identifiers.  Location: Patient: The patient is at home. Provider: The physician is in office.   I discussed the limitations of evaluation and management by telemedicine and the availability of in person appointments. The patient expressed understanding and agreed to proceed.  History of Present Illness: Melvin Moore is a 28 year old black male with a history of mental retardation and seizures.  The patient is having unusual patterns of refusal to eat and drink for 3 to 5 days, during these episodes he may not sleep well at night either.  Then he will cycle back into his more normal behavior, he will eat quite well, and sleep well.  The patient has not had any recurrent seizures, he remains on Depakote and Keppra.  He is tolerating these medications well.  He is not on any medications to help him rest at night.  The patient is not losing weight.  No other new medical issues have come up since last seen.   Observations/Objective: The evaluation reveals that the patient is alert, he will follow some commands.  He is nonverbal.  Face appears to be symmetric.  He is able to ambulate without assistance, gait is slightly wide-based.  Assessment and Plan: 1.  Mental retardation, cerebral palsy  2.  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  The patient will be given melatonin 5 mg at night for sleep.  The patient will remain on Depakote and Keppra.  A prescription will be sent in for these medications.  He will follow-up in 6 months, we will check blood work at that time.  Follow Up Instructions: 30-month follow-up, may see nurse practitioner.   I discussed the assessment  and treatment plan with the patient. The patient was provided an opportunity to ask questions and all were answered. The patient agreed with the plan and demonstrated an understanding of the instructions.   The patient was advised to call back or seek an in-person evaluation if the symptoms worsen or if the condition fails to improve as anticipated.  I provided 15 minutes of non-face-to-face time during this encounter.   Melvin Spaniel, MD

## 2019-05-02 ENCOUNTER — Ambulatory Visit: Payer: Medicare HMO | Admitting: Neurology

## 2019-06-16 ENCOUNTER — Other Ambulatory Visit: Payer: Self-pay | Admitting: Neurology

## 2019-10-17 ENCOUNTER — Ambulatory Visit: Payer: Medicare HMO | Admitting: Neurology

## 2019-10-22 NOTE — Progress Notes (Signed)
PATIENT: Melvin Moore DOB: [birth year]  REASON FOR VISIT: follow up HISTORY FROM: patient  HISTORY OF PRESENT ILLNESS: Today 10/23/19  Melvin Moore is a 28 year old male with history of mental retardation and seizures.  He remains on Depakote and Keppra.  After his last visit, he complained of unusual patterns of refusal to eat or drink, where he was not sleeping well at night.  He was started on melatonin, but did not start the medication.  He has not had recurrent seizure since last seen.  He has limited verbal response.  He continues to have difficulty sleeping, there may be some days where he does not sleep.  This has been a recurrent pattern for him.  He has not had any falls.  He denies any new medical issues.  He lives with his sister, Malachy Mood.  His mother passed away 1 year ago.  He requires assistance with his daily activities.  He takes liquid Keppra, because this was switched during a period of time when it was difficult to get him to take his medications.  In general, he is taking his medications well.  His sister indicates, today he is having a good day.  They present today for follow-up.  HISTORY  04/16/2019 Dr. Jannifer Franklin: Melvin Moore is a 28 year old black male with a history of mental retardation and seizures.  The patient is having unusual patterns of refusal to eat and drink for 3 to 5 days, during these episodes he may not sleep well at night either.  Then he will cycle back into his more normal behavior, he will eat quite well, and sleep well.  The patient has not had any recurrent seizures, he remains on Depakote and Keppra.  He is tolerating these medications well.  He is not on any medications to help him rest at night.  The patient is not losing weight.  No other new medical issues have come up since last seen.  REVIEW OF SYSTEMS: Out of a complete 14 system review of symptoms, the patient complains only of the following symptoms, and all other reviewed systems are  negative.  Seizures  ALLERGIES: No Known Allergies  HOME MEDICATIONS: Outpatient Medications Prior to Visit  Medication Sig Dispense Refill  . divalproex (DEPAKOTE) 125 MG DR tablet Take 2 tablets (250 mg total) by mouth 2 (two) times daily. 360 tablet 1  . levETIRAcetam (KEPPRA) 100 MG/ML solution TAKE 5 MLS BY MOUTH TWICE A DAY 900 mL 1  . mirtazapine (REMERON) 30 MG tablet Take 30 mg by mouth at bedtime.     . Vitamin D, Ergocalciferol, (DRISDOL) 1.25 MG (50000 UT) CAPS capsule Take 50,000 Units by mouth every 7 (seven) days.     No facility-administered medications prior to visit.     PAST MEDICAL HISTORY: Past Medical History:  Diagnosis Date  . Autistic disorder   . Cerebral palsy ([place])   . Hyperactive   . MR (mental retardation)    "75%" per mother  . Overactive bladder   . Seizures ([place])     PAST SURGICAL HISTORY: Past Surgical History:  Procedure Laterality Date  . LAPAROSCOPIC REVISION VENTRICULAR-PERITONEAL (V-P) SHUNT  11/22/2012   Procedure: LAPAROSCOPIC REVISION VENTRICULAR-PERITONEAL (V-P) SHUNT;  Surgeon: Odis Hollingshead, MD;  Location: [place];  Service: General;  Laterality: N/A;  Laparoscopic Repositioning of Ventricular-Peritoneal Shunt  . SHUNT REPLACEMENT    . SHUNT REVISION VENTRICULAR-PERITONEAL  11/22/2012   Procedure: SHUNT REVISION VENTRICULAR-PERITONEAL;  Surgeon: Erline Levine, MD;  Location: [facility]  OR;  Service: Neurosurgery;  Laterality: N/A;  Laparoscopic Repositioning of Ventricular-Peritoneal shunt     FAMILY HISTORY: Family History  Adopted: Yes    SOCIAL HISTORY: Social History   Socioeconomic History  . Marital status: Single    Spouse name: Not on file  . Number of children: 0  . Years of education: Day Program  . Highest education level: Not on file  Occupational History  . Occupation: Disabled  Social Needs  . Financial resource strain: Not on file  . Food insecurity    Worry: Not on file    Inability: Not on file  .  Transportation needs    Medical: Not on file    Non-medical: Not on file  Tobacco Use  . Smoking status: Never Smoker  . Smokeless tobacco: Never Used  Substance and Sexual Activity  . Alcohol use: No  . Drug use: No  . Sexual activity: Not on file  Lifestyle  . Physical activity    Days per week: Not on file    Minutes per session: Not on file  . Stress: Not on file  Relationships  . Social Musician on phone: Not on file    Gets together: Not on file    Attends religious service: Not on file    Active member of club or organization: Not on file    Attends meetings of clubs or organizations: Not on file    Relationship status: Not on file  . Intimate partner violence    Fear of current or ex partner: Not on file    Emotionally abused: Not on file    Physically abused: Not on file    Forced sexual activity: Not on file  Other Topics Concern  . Not on file  Social History Narrative   Lives at home w/ his guardian Talbert Forest), aunt/caregiver    PHYSICAL EXAM  Vitals:   10/23/19 0949 10/23/19 0953  BP: 94/63 98/66  Pulse: 69   Temp: 98.2 F (36.8 C)   Weight: 127 lb 3.2 oz (57.7 kg)   Height: 5\' 4"  (1.626 m)    Body mass index is 21.83 kg/m.  Generalized: Well developed, in no acute distress   Neurological examination  Mentation: Alert, no verbal response, history is provided by his sister, mild difficulty following exam commands Cranial nerve II-XII: Pupils were equal round reactive to light. Extraocular movements were full, visual fields were full on confrontational test. Facial sensation and strength were normal. Head turning and shoulder shrug  were normal and symmetric. Motor: He has movement of all 4 extremities Sensory: Sensory testing is intact to soft touch on all 4 extremities. No evidence of extinction is noted.  Coordination: Cerebellar testing reveals good finger-nose-finger bilaterally Gait and station: Gait is wide-based, is able to walk  without assistance Reflexes: Deep tendon reflexes are symmetric    DIAGNOSTIC DATA (LABS, IMAGING, TESTING) - I reviewed patient records, labs, notes, testing and imaging myself where available.  Lab Results  Component Value Date   WBC 10.2 10/24/2018   HGB 16.2 10/24/2018   HCT 51.6 10/24/2018   MCV 92.1 10/24/2018   PLT  10/24/2018    PLATELET CLUMPS NOTED ON SMEAR, COUNT APPEARS ADEQUATE      Component Value Date/Time   NA 137 10/24/2018 1638   K 4.7 10/24/2018 1638   CL 103 10/24/2018 1638   CO2 20 (L) 10/24/2018 1638   GLUCOSE 155 (H) 10/24/2018 1638   BUN 11 10/24/2018  1638   CREATININE 1.14 10/24/2018 1638   CALCIUM 10.0 10/24/2018 1638   PROT 7.5 10/24/2018 1638   ALBUMIN 4.8 10/24/2018 1638   AST 29 10/24/2018 1638   ALT 12 10/24/2018 1638   ALKPHOS 47 10/24/2018 1638   BILITOT 0.9 10/24/2018 1638   GFRNONAA >60 10/24/2018 1638   GFRAA >60 10/24/2018 1638   No results found for: CHOL, HDL, LDLCALC, LDLDIRECT, TRIG, CHOLHDL No results found for: ZOXW9UHGBA1C No results found for: VITAMINB12 No results found for: TSH  ASSESSMENT AND PLAN 28 y.o. year old male  has a past medical history of Autistic disorder, Cerebral palsy (HCC), Hyperactive, MR (mental retardation), Overactive bladder, and Seizures (HCC). here with:  1.  Autism, nonverbal state 2.  Seizures 3.  Chronic sleep disturbance  He has not had recurrent seizure since last seen.  He will remain on Depakote and Keppra.  He was placed on liquid Keppra and Depakote sprinkles, due to history of prolonged not eating or drinking, so that the medication could still be administered.  His sister indicates that he has been doing well.  They will try melatonin 5 mg at night for sleep.  I will check routine lab work today.  He will follow-up in 6 months or sooner if needed.  I will refill Depakote 125 DR tablet, 2 tablets twice daily, Keppra 100 mg/ml, 5 ml twice daily.   I spent 15 minutes with the patient. 50% of this  time was spent discussing his plan of care.  Margie EgeSarah Sam Wunschel, AGNP-C, DNP 10/23/2019, 10:20 AM [facility] [address], [address] [place], [place] [address] [phone]

## 2019-10-23 ENCOUNTER — Encounter: Payer: Self-pay | Admitting: Neurology

## 2019-10-23 ENCOUNTER — Ambulatory Visit (INDEPENDENT_AMBULATORY_CARE_PROVIDER_SITE_OTHER): Payer: Medicare HMO | Admitting: Neurology

## 2019-10-23 ENCOUNTER — Other Ambulatory Visit: Payer: Self-pay

## 2019-10-23 VITALS — BP 98/66 | HR 69 | Temp 98.2°F | Ht 64.0 in | Wt 127.2 lb

## 2019-10-23 DIAGNOSIS — R569 Unspecified convulsions: Secondary | ICD-10-CM

## 2019-10-23 MED ORDER — LEVETIRACETAM 100 MG/ML PO SOLN: ORAL | 1 refills | Status: DC

## 2019-10-23 MED ORDER — DIVALPROEX SODIUM 125 MG PO DR TAB: 250.0000 mg | DELAYED_RELEASE_TABLET | Freq: Two times a day (BID) | ORAL | 1 refills | Status: DC

## 2019-10-23 NOTE — Patient Instructions (Signed)
You may try melatonin 5 mg at bedtime to help with sleep. Continue Keppra and Depakote.

## 2019-10-23 NOTE — Progress Notes (Signed)
I have read the note, and I agree with the clinical assessment and plan.  Charles K Willis   

## 2019-10-24 LAB — CBC WITH DIFFERENTIAL/PLATELET
Basophils Absolute: 0 10*3/uL (ref 0.0–0.2)
Basos: 0 %
EOS (ABSOLUTE): 0.2 10*3/uL (ref 0.0–0.4)
Eos: 3 %
Hematocrit: 44.1 % (ref 37.5–51.0)
Hemoglobin: 14.5 g/dL (ref 13.0–17.7)
Immature Grans (Abs): 0 10*3/uL (ref 0.0–0.1)
Immature Granulocytes: 0 %
Lymphocytes Absolute: 2.3 10*3/uL (ref 0.7–3.1)
Lymphs: 30 %
MCH: 29.8 pg (ref 26.6–33.0)
MCHC: 32.9 g/dL (ref 31.5–35.7)
MCV: 91 fL (ref 79–97)
Monocytes Absolute: 0.7 10*3/uL (ref 0.1–0.9)
Monocytes: 9 %
Neutrophils Absolute: 4.4 10*3/uL (ref 1.4–7.0)
Neutrophils: 58 %
Platelets: 284 10*3/uL (ref 150–450)
RBC: 4.86 x10E6/uL (ref 4.14–5.80)
RDW: 13.1 % (ref 11.6–15.4)
WBC: 7.6 10*3/uL (ref 3.4–10.8)

## 2019-10-24 LAB — COMPREHENSIVE METABOLIC PANEL
ALT: 8 IU/L (ref 0–44)
AST: 11 IU/L (ref 0–40)
Albumin/Globulin Ratio: 1.8 (ref 1.2–2.2)
Albumin: 4.4 g/dL (ref 4.1–5.2)
Alkaline Phosphatase: 46 IU/L (ref 39–117)
BUN/Creatinine Ratio: 19 (ref 9–20)
BUN: 16 mg/dL (ref 6–20)
Bilirubin Total: 0.5 mg/dL (ref 0.0–1.2)
CO2: 26 mmol/L (ref 20–29)
Calcium: 9.2 mg/dL (ref 8.7–10.2)
Chloride: 103 mmol/L (ref 96–106)
Creatinine, Ser: 0.85 mg/dL (ref 0.76–1.27)
GFR calc Af Amer: 135 mL/min/{1.73_m2} (ref >59)
GFR calc non Af Amer: 117 mL/min/{1.73_m2} (ref >59)
Globulin, Total: 2.5 g/dL (ref 1.5–4.5)
Glucose: 81 mg/dL (ref 65–99)
Potassium: 4.6 mmol/L (ref 3.5–5.2)
Sodium: 140 mmol/L (ref 134–144)
Total Protein: 6.9 g/dL (ref 6.0–8.5)

## 2019-10-24 LAB — VALPROIC ACID LEVEL: Valproic Acid Lvl: 25 ug/mL — ABNORMAL LOW (ref 50–100)

## 2019-10-28 ENCOUNTER — Telehealth: Payer: Self-pay | Admitting: *Deleted

## 2019-10-28 NOTE — Telephone Encounter (Signed)
Spoke with [employer] and advised her that the NP stated she will not change medication dose; he has been stable on same dose for a while. I advised she call us for any questions or concerns. She verbalized understanding, appreciation.

## 2019-10-28 NOTE — Telephone Encounter (Signed)
Called patient's sister, guardian Johnanna Schneiders and informed her that his labs show a low level Depakote. At last office visit he was doing very well and had not had recurrent seizure. I asked if he had missed any doses. She stated that at times he will refuse to take medication, but she has not noticed that lately.  She denied that he has had any recent seizure activity. She asked what she should do. I advised I'll talk with NP and call her back. She verbalized understanding, appreciation.

## 2020-04-21 ENCOUNTER — Other Ambulatory Visit: Payer: Self-pay

## 2020-04-21 ENCOUNTER — Encounter: Payer: Self-pay | Admitting: Neurology

## 2020-04-21 ENCOUNTER — Ambulatory Visit (INDEPENDENT_AMBULATORY_CARE_PROVIDER_SITE_OTHER): Payer: Medicare HMO | Admitting: Neurology

## 2020-04-21 VITALS — BP 118/74 | HR 92 | Temp 97.6°F | Ht 64.0 in | Wt 122.5 lb

## 2020-04-21 DIAGNOSIS — Z5181 Encounter for therapeutic drug level monitoring: Secondary | ICD-10-CM | POA: Diagnosis not present

## 2020-04-21 DIAGNOSIS — R569 Unspecified convulsions: Secondary | ICD-10-CM | POA: Diagnosis not present

## 2020-04-21 DIAGNOSIS — E237 Disorder of pituitary gland, unspecified: Secondary | ICD-10-CM

## 2020-04-21 DIAGNOSIS — F840 Autistic disorder: Secondary | ICD-10-CM

## 2020-04-21 HISTORY — DX: Disorder of pituitary gland, unspecified: E23.7

## 2020-04-21 MED ORDER — LEVETIRACETAM 100 MG/ML PO SOLN: ORAL | 3 refills | Status: DC

## 2020-04-21 MED ORDER — DIVALPROEX SODIUM 125 MG PO DR TAB: 250.0000 mg | DELAYED_RELEASE_TABLET | Freq: Two times a day (BID) | ORAL | 3 refills | Status: DC

## 2020-04-21 NOTE — Progress Notes (Signed)
Reason for visit: Seizures  Melvin Moore is an 29 y.o. male  History of present illness:  Melvin Moore is a 29 year old black gentleman with a history of autism/mental retardation and a history of seizures.  His seizures have been well controlled, he is on low-dose Depakote and he takes Keppra.  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  Over time, he has been very stable with his weight, there has only been a 1 pound difference in his weight over the last 18 months.  The patient has had no further clinical seizures.  He is taking his medications fairly well.  He returns for an evaluation.   Past Medical History:  Diagnosis Date  . Autistic disorder   . Cerebral palsy (HCC)   . Hyperactive   . MR (mental retardation)    "75%" per mother  . Overactive bladder   . Seizures (HCC)     Past Surgical History:  Procedure Laterality Date  . LAPAROSCOPIC REVISION VENTRICULAR-PERITONEAL (V-P) SHUNT  11/22/2012   Procedure: LAPAROSCOPIC REVISION VENTRICULAR-PERITONEAL (V-P) SHUNT;  Surgeon: Adolph Pollack, MD;  Location: [facility];  Service: General;  Laterality: N/A;  Laparoscopic Repositioning of Ventricular-Peritoneal Shunt  . SHUNT REPLACEMENT    . SHUNT REVISION VENTRICULAR-PERITONEAL  11/22/2012   Procedure: SHUNT REVISION VENTRICULAR-PERITONEAL;  Surgeon: Maeola Harman, MD;  Location: [facility] OR;  Service: Neurosurgery;  Laterality: N/A;  Laparoscopic Repositioning of Ventricular-Peritoneal shunt     Family History  Adopted: Yes    Social history:  reports that he has never smoked. He has never used smokeless tobacco. He reports that he does not drink alcohol or use drugs.   No Known Allergies  Medications:  Prior to Admission medications   Medication Sig Start Date End Date Taking? Authorizing Provider  divalproex (DEPAKOTE) 125 MG DR tablet Take 2  tablets (250 mg total) by mouth 2 (two) times daily. 10/23/19  Yes Glean Salvo, NP  levETIRAcetam (KEPPRA) 100 MG/ML solution TAKE 5 MLS BY MOUTH TWICE A DAY 10/23/19  Yes Glean Salvo, NP  mirtazapine (REMERON) 30 MG tablet Take 30 mg by mouth at bedtime.  09/15/16  Yes [provider]  Vitamin D, Ergocalciferol, (DRISDOL) 1.25 MG (50000 UT) CAPS capsule Take 50,000 Units by mouth every 7 (seven) days.   Yes [provider]    ROS:  Out of a complete 14 system review of symptoms, the patient complains only of the following symptoms, and all other reviewed systems are negative.  Insomnia, decreased appetite  Blood pressure 118/74, pulse 92, temperature 97.6 F (36.4 C), height 5\' 4"  (1.626 m), weight 122 lb 8 oz (55.6 kg).  Physical Exam  General: The patient is alert at the time of examination, he will not follow commands at all times.  Skin: No significant peripheral edema is noted.   Neurologic Exam  Mental status: The patient is alert, he is nonverbal, he will not consistently follow commands.   Cranial nerves: Facial symmetry is present.  Speech cannot be evaluated, the patient is mute. Extraocular movements are full. Visual fields are full to threat.  Motor: The patient has good strength in all 4 extremities.  Sensory examination: Pain sensation is symmetric on the face, arms, and legs.  Coordination: The patient would not cooperate for cerebellar testing, no obvious asymmetry of coordination is seen.  Gait and station:  The patient has the ability to walk independently, gait is wide-based.  Reflexes: Deep tendon reflexes are symmetric.   Assessment/Plan:  1.  Autism, mental retardation  2.  History of seizures, well controlled  3.  Hypothalamic disorder  The patient continues to cycle with his sleep and appetite alterations.  The patient is well controlled with the seizures.  He is able to maintain his weight.  We will check blood work  today, he will follow-up here in 1 year, sooner if needed.  A prescription for his Depakote and Keppra are sent in.  Jill Alexanders MD 04/21/2020 10:11 AM  [facility] [address] [place], [place] [address]  Phone [phone] Fax [phone]

## 2020-04-23 LAB — COMPREHENSIVE METABOLIC PANEL
ALT: 7 IU/L (ref 0–44)
AST: 16 IU/L (ref 0–40)
Albumin/Globulin Ratio: 1.7 (ref 1.2–2.2)
Albumin: 4.9 g/dL (ref 4.1–5.2)
Alkaline Phosphatase: 59 IU/L (ref 39–117)
BUN/Creatinine Ratio: 13 (ref 9–20)
BUN: 14 mg/dL (ref 6–20)
Bilirubin Total: 0.7 mg/dL (ref 0.0–1.2)
CO2: 25 mmol/L (ref 20–29)
Calcium: 10.6 mg/dL — ABNORMAL HIGH (ref 8.7–10.2)
Chloride: 102 mmol/L (ref 96–106)
Creatinine, Ser: 1.12 mg/dL (ref 0.76–1.27)
GFR calc Af Amer: 101 mL/min/{1.73_m2} (ref >59)
GFR calc non Af Amer: 88 mL/min/{1.73_m2} (ref >59)
Globulin, Total: 2.9 g/dL (ref 1.5–4.5)
Glucose: 86 mg/dL (ref 65–99)
Potassium: 4.2 mmol/L (ref 3.5–5.2)
Sodium: 142 mmol/L (ref 134–144)
Total Protein: 7.8 g/dL (ref 6.0–8.5)

## 2020-04-23 LAB — CBC WITH DIFFERENTIAL/PLATELET
Basophils Absolute: 0 10*3/uL (ref 0.0–0.2)
Basos: 1 %
EOS (ABSOLUTE): 0.2 10*3/uL (ref 0.0–0.4)
Eos: 2 %
Hematocrit: 52.9 % — ABNORMAL HIGH (ref 37.5–51.0)
Hemoglobin: 18.1 g/dL — ABNORMAL HIGH (ref 13.0–17.7)
Immature Grans (Abs): 0 10*3/uL (ref 0.0–0.1)
Immature Granulocytes: 0 %
Lymphocytes Absolute: 2.1 10*3/uL (ref 0.7–3.1)
Lymphs: 28 %
MCH: 30.5 pg (ref 26.6–33.0)
MCHC: 34.2 g/dL (ref 31.5–35.7)
MCV: 89 fL (ref 79–97)
Monocytes Absolute: 0.6 10*3/uL (ref 0.1–0.9)
Monocytes: 8 %
Neutrophils Absolute: 4.4 10*3/uL (ref 1.4–7.0)
Neutrophils: 61 %
Platelets: 369 10*3/uL (ref 150–450)
RBC: 5.94 x10E6/uL — ABNORMAL HIGH (ref 4.14–5.80)
RDW: 13.2 % (ref 11.6–15.4)
WBC: 7.3 10*3/uL (ref 3.4–10.8)

## 2020-04-23 LAB — VALPROIC ACID LEVEL: Valproic Acid Lvl: 55 ug/mL (ref 50–100)

## 2020-04-23 LAB — LEVETIRACETAM LEVEL: Levetiracetam Lvl: 23.9 ug/mL (ref 10.0–40.0)

## 2020-04-23 LAB — AMMONIA: Ammonia: 73 ug/dL (ref 36–136)

## 2020-05-04 ENCOUNTER — Telehealth: Payer: Self-pay

## 2020-05-04 NOTE — Telephone Encounter (Signed)
-----   Message from York Spaniel, MD sent at 04/27/2020  9:26 AM EDT ----- Blood work overall is unremarkable, there is a minimal elevation of the calcium level and the hemoglobin level, not clinically significant. Depakote level is therapeutic, no change in medical therapy is recommended. Please call the patient. ----- Message ----- From: Nell Range Lab Results In Sent: 04/22/2020   7:38 AM EDT To: York Spaniel, MD

## 2020-05-04 NOTE — Telephone Encounter (Signed)
I called pt sister Sheril on dpr . I stated Blood work overall is unremarkable, there is a minimal elevation of the calcium level and the hemoglobin level, not clinically significant. Depakote level is therapeutic, no change in medical therapy is recommended.The sister verbalized understanding,

## 2021-04-25 ENCOUNTER — Ambulatory Visit: Payer: Medicare HMO | Admitting: Neurology

## 2021-04-26 ENCOUNTER — Other Ambulatory Visit: Payer: Self-pay | Admitting: Neurology

## 2021-05-26 ENCOUNTER — Ambulatory Visit: Payer: Medicare Other | Admitting: Neurology

## 2021-06-04 ENCOUNTER — Other Ambulatory Visit: Payer: Self-pay | Admitting: Neurology

## 2021-06-14 ENCOUNTER — Ambulatory Visit (INDEPENDENT_AMBULATORY_CARE_PROVIDER_SITE_OTHER): Payer: Medicare Other | Admitting: Neurology

## 2021-06-14 ENCOUNTER — Encounter: Payer: Self-pay | Admitting: Neurology

## 2021-06-14 VITALS — BP 130/83 | HR 82 | Wt 131.0 lb

## 2021-06-14 DIAGNOSIS — R569 Unspecified convulsions: Secondary | ICD-10-CM | POA: Diagnosis not present

## 2021-06-14 DIAGNOSIS — E237 Disorder of pituitary gland, unspecified: Secondary | ICD-10-CM

## 2021-06-14 MED ORDER — DIVALPROEX SODIUM 125 MG PO DR TAB: DELAYED_RELEASE_TABLET | ORAL | 4 refills | Status: DC

## 2021-06-14 MED ORDER — LEVETIRACETAM 500 MG PO TABS: 500.0000 mg | ORAL_TABLET | Freq: Two times a day (BID) | ORAL | 4 refills | Status: DC

## 2021-06-14 NOTE — Progress Notes (Signed)
I have read the note, and I agree with the clinical assessment and plan.  Kalli Greenfield K Journe Hallmark   

## 2021-06-14 NOTE — Progress Notes (Signed)
PATIENT: Melvin Moore DOB: 10-21-1991  REASON FOR VISIT: follow up HISTORY FROM: patient Primary Neurologist: Dr. Anne Hahn   Today 06/14/21 Melvin Moore is a 30 year old male with history of seizures and autism/mental retardation.  Well-controlled on Depakote and Keppra.  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 He just entered this cycle today, just had 8 good days in a row, went to cook out with family yesterday, had great time.  No seizures.  He lives with his sister, she wishes to have Keppra liquid to switch back to tablet. Is difficult to get the syringe and draw up with liquid.  Weight is up about 10 pounds since last visit.  Denies any health issues.  Here today accompanied by his sister.  HISTORY 04/21/2020 Dr. Anne Hahn: Melvin Moore is a 30 year old black gentleman with a history of autism/mental retardation and a history of seizures.  His seizures have been well controlled, he is on low-dose Depakote and he takes Keppra.  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  Over time, he has been very stable with his weight, there has only been a 1 pound difference in his weight over the last 18 months.  The patient has had no further clinical seizures.  He is taking his medications fairly well.  He returns for an evaluation.  REVIEW OF SYSTEMS: Out of a complete 14 system review of symptoms, the patient complains only of the following symptoms, and all other reviewed systems are negative.  See HPI  ALLERGIES: No Known Allergies  HOME MEDICATIONS: Outpatient Medications Prior to Visit  Medication Sig Dispense Refill   mirtazapine (REMERON) 30 MG tablet Take 30 mg by mouth at bedtime.      divalproex (DEPAKOTE) 125 MG DR tablet TAKE TWO TABLETS (250 MG TOTAL) BY MOUTH TWO (TWO) TIMES DAILY. MUST KEEP FOLLOW UP  05/26/21 FOR ONGOING REFILLS 120 tablet 0   levETIRAcetam (KEPPRA) 100 MG/ML solution TAKE 5 MLS BY MOUTH TWICE A DAY. MUST KEEP FOLLOW UP 05/26/21 FOR ONGOING REFILLS 300 mL 0   Vitamin D, Ergocalciferol, (DRISDOL) 1.25 MG (50000 UT) CAPS capsule Take 50,000 Units by mouth every 7 (seven) days.     No facility-administered medications prior to visit.    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   MR (mental retardation)    "75%" per mother   Overactive bladder    Seizures (HCC)     PAST SURGICAL HISTORY: Past Surgical History:  Procedure Laterality Date   LAPAROSCOPIC REVISION VENTRICULAR-PERITONEAL (V-P) SHUNT  11/22/2012   Procedure: LAPAROSCOPIC REVISION VENTRICULAR-PERITONEAL (V-P) SHUNT;  Surgeon: Adolph Pollack, MD;  Location: [facility] OR;  Service: General;  Laterality: N/A;  Laparoscopic Repositioning of Ventricular-Peritoneal Shunt   SHUNT REPLACEMENT     SHUNT REVISION VENTRICULAR-PERITONEAL  11/22/2012   Procedure: SHUNT REVISION VENTRICULAR-PERITONEAL;  Surgeon: Maeola Harman, MD;  Location: [facility] OR;  Service: Neurosurgery;  Laterality: N/A;  Laparoscopic Repositioning of Ventricular-Peritoneal shunt     FAMILY HISTORY: Family History  Adopted: Yes    SOCIAL HISTORY: Social History   Socioeconomic History   Marital status: Single    Spouse name: Not on file   Number of children: 0   Years of education:  Day Program   Highest education level: Not on file  Occupational History   Occupation: Disabled  Tobacco Use   Smoking status: Never   Smokeless tobacco: Never  Substance and Sexual Activity   Alcohol use: No   Drug use: No   Sexual activity: Not on file  Other Topics Concern   Not on file  Social History Narrative   Lives at home w/ his guardian Talbert Forest), aunt/caregiver   Social Determinants of Health   Financial Resource Strain: Not  on file  Food Insecurity: Not on file  Transportation Needs: Not on file  Physical Activity: Not on file  Stress: Not on file  Social Connections: Not on file  Intimate Partner Violence: Not on file   PHYSICAL EXAM  Vitals:   06/14/21 0823  BP: 130/83  Pulse: 82  Weight: 131 lb (59.4 kg)   Body mass index is 22.49 kg/m.  Generalized: Well developed, in no acute distress  Neurological examination  Mentation: Alert, difficulty following exam commands, there is no verbal response Cranial nerve II-XII: Pupils were equal round reactive to light. Extraocular movements were full, visual field were full on confrontational test. Facial sensation and strength were normal. Head turning normal. At baseline, rotating head side to side Motor: Good strength all extremities Sensory: Sensory testing is intact to soft touch on all 4 extremities. No evidence of extinction is noted.  Coordination: Could not perform exam commands Gait and station: Can walk independently, is wide-based Reflexes: Deep tendon reflexes are symmetric and normal bilaterally.   DIAGNOSTIC DATA (LABS, IMAGING, TESTING) - I reviewed patient records, labs, notes, testing and imaging myself where available.  Lab Results  Component Value Date   WBC 7.3 04/21/2020   HGB 18.1 (H) 04/21/2020   HCT 52.9 (H) 04/21/2020   MCV 89 04/21/2020   PLT 369 04/21/2020      Component Value Date/Time   NA 142 04/21/2020 1034   K 4.2 04/21/2020 1034   CL 102 04/21/2020 1034   CO2 25 04/21/2020 1034   GLUCOSE 86 04/21/2020 1034   GLUCOSE 155 (H) 10/24/2018 1638   BUN 14 04/21/2020 1034   CREATININE 1.12 04/21/2020 1034   CALCIUM 10.6 (H) 04/21/2020 1034   PROT 7.8 04/21/2020 1034   ALBUMIN 4.9 04/21/2020 1034   AST 16 04/21/2020 1034   ALT 7 04/21/2020 1034   ALKPHOS 59 04/21/2020 1034   BILITOT 0.7 04/21/2020 1034   GFRNONAA 88 04/21/2020 1034   GFRAA 101 04/21/2020 1034   No results found for: CHOL, HDL, LDLCALC,  LDLDIRECT, TRIG, CHOLHDL No results found for: JEHU3J No results found for: VITAMINB12 No results found for: TSH  ASSESSMENT AND PLAN 30 y.o. 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 here with:  1.  Autism, mental retardation 2.  History of seizures, well controlled 3.  Hypothalamic disorder  -No recent recurrent seizures -Continue Depakote, Keppra, but will switch from liquid Keppra back to tablet, at his sister's request 500 mg twice a day -Check routine labs today, including ammonia level, as he is currently in a asleep alteration state -Follow-up in 1 year or sooner if needed  Otila Kluver, DNP 06/14/2021, 8:49 AM [facility] [address] [place], [place] [address] [phone]

## 2021-06-14 NOTE — Patient Instructions (Signed)
Continue current medications, but will switch to Keppra tablet  Check labs today  Call for any seizures  See you back in 1 year

## 2021-06-16 LAB — COMPREHENSIVE METABOLIC PANEL
ALT: 18 IU/L (ref 0–44)
AST: 16 IU/L (ref 0–40)
Albumin/Globulin Ratio: 1.8 (ref 1.2–2.2)
Albumin: 4.8 g/dL (ref 4.0–5.0)
Alkaline Phosphatase: 54 IU/L (ref 44–121)
BUN/Creatinine Ratio: 12 (ref 9–20)
BUN: 13 mg/dL (ref 6–20)
Bilirubin Total: 0.2 mg/dL (ref 0.0–1.2)
CO2: 27 mmol/L (ref 20–29)
Calcium: 10.1 mg/dL (ref 8.7–10.2)
Chloride: 99 mmol/L (ref 96–106)
Creatinine, Ser: 1.08 mg/dL (ref 0.76–1.27)
Globulin, Total: 2.7 g/dL (ref 1.5–4.5)
Glucose: 95 mg/dL (ref 65–99)
Potassium: 4.7 mmol/L (ref 3.5–5.2)
Sodium: 142 mmol/L (ref 134–144)
Total Protein: 7.5 g/dL (ref 6.0–8.5)
eGFR: 94 mL/min/{1.73_m2} (ref >59)

## 2021-06-16 LAB — CBC WITH DIFFERENTIAL/PLATELET
Basophils Absolute: 0 10*3/uL (ref 0.0–0.2)
Basos: 1 %
EOS (ABSOLUTE): 0.1 10*3/uL (ref 0.0–0.4)
Eos: 2 %
Hematocrit: 47.5 % (ref 37.5–51.0)
Hemoglobin: 15.7 g/dL (ref 13.0–17.7)
Immature Grans (Abs): 0.1 10*3/uL (ref 0.0–0.1)
Immature Granulocytes: 1 %
Lymphocytes Absolute: 1.7 10*3/uL (ref 0.7–3.1)
Lymphs: 28 %
MCH: 29.6 pg (ref 26.6–33.0)
MCHC: 33.1 g/dL (ref 31.5–35.7)
MCV: 90 fL (ref 79–97)
Monocytes Absolute: 0.5 10*3/uL (ref 0.1–0.9)
Monocytes: 8 %
Neutrophils Absolute: 3.6 10*3/uL (ref 1.4–7.0)
Neutrophils: 60 %
Platelets: 306 10*3/uL (ref 150–450)
RBC: 5.3 x10E6/uL (ref 4.14–5.80)
RDW: 13.7 % (ref 11.6–15.4)
WBC: 5.9 10*3/uL (ref 3.4–10.8)

## 2021-06-16 LAB — VALPROIC ACID LEVEL: Valproic Acid Lvl: 32 ug/mL — ABNORMAL LOW (ref 50–100)

## 2021-06-16 LAB — AMMONIA: Ammonia: 60 ug/dL (ref 40–160)

## 2021-06-16 LAB — LEVETIRACETAM LEVEL: Levetiracetam Lvl: 12.4 ug/mL (ref 10.0–40.0)

## 2021-06-20 ENCOUNTER — Telehealth: Payer: Self-pay | Admitting: *Deleted

## 2021-06-20 NOTE — Telephone Encounter (Signed)
I spoke to the patient's sister on DPR. She verbalized understanding of the results and plan.

## 2021-06-20 NOTE — Telephone Encounter (Signed)
-----   Message from Glean Salvo, NP sent at 06/20/2021  5:59 AM EDT ----- Blood work in unremarkable. Please continue with current medications, call for any problems. The call will go to his sister.

## 2022-03-30 DIAGNOSIS — F840 Autistic disorder: Secondary | ICD-10-CM | POA: Diagnosis not present

## 2022-03-30 DIAGNOSIS — G809 Cerebral palsy, unspecified: Secondary | ICD-10-CM | POA: Diagnosis not present

## 2022-03-30 DIAGNOSIS — G4700 Insomnia, unspecified: Secondary | ICD-10-CM | POA: Diagnosis not present

## 2022-03-30 DIAGNOSIS — K117 Disturbances of salivary secretion: Secondary | ICD-10-CM | POA: Diagnosis not present

## 2022-03-30 DIAGNOSIS — Z79899 Other long term (current) drug therapy: Secondary | ICD-10-CM | POA: Diagnosis not present

## 2022-03-30 DIAGNOSIS — E46 Unspecified protein-calorie malnutrition: Secondary | ICD-10-CM | POA: Diagnosis not present

## 2022-03-30 DIAGNOSIS — R41841 Cognitive communication deficit: Secondary | ICD-10-CM | POA: Diagnosis not present

## 2022-03-30 DIAGNOSIS — G40909 Epilepsy, unspecified, not intractable, without status epilepticus: Secondary | ICD-10-CM | POA: Diagnosis not present

## 2022-03-30 DIAGNOSIS — Z0001 Encounter for general adult medical examination with abnormal findings: Secondary | ICD-10-CM | POA: Diagnosis not present

## 2022-06-14 ENCOUNTER — Ambulatory Visit (INDEPENDENT_AMBULATORY_CARE_PROVIDER_SITE_OTHER): Payer: Medicare HMO | Admitting: Neurology

## 2022-06-14 ENCOUNTER — Encounter: Payer: Self-pay | Admitting: Neurology

## 2022-06-14 VITALS — BP 111/74 | HR 77 | Ht 64.0 in | Wt 132.0 lb

## 2022-06-14 DIAGNOSIS — E237 Disorder of pituitary gland, unspecified: Secondary | ICD-10-CM | POA: Diagnosis not present

## 2022-06-14 DIAGNOSIS — R569 Unspecified convulsions: Secondary | ICD-10-CM

## 2022-06-14 MED ORDER — DIVALPROEX SODIUM 125 MG PO DR TAB
DELAYED_RELEASE_TABLET | ORAL | 4 refills | Status: DC
Start: 2022-06-14 — End: 2023-06-18

## 2022-06-14 MED ORDER — LEVETIRACETAM 500 MG PO TABS: 500.0000 mg | ORAL_TABLET | Freq: Two times a day (BID) | ORAL | 4 refills | Status: DC

## 2022-06-14 NOTE — Patient Instructions (Signed)
Meds ordered this encounter  Medications   levETIRAcetam (KEPPRA) 500 MG tablet    Sig: Take 1 tablet (500 mg total) by mouth 2 (two) times daily.    Dispense:  180 tablet    Refill:  4   divalproex (DEPAKOTE) 125 MG DR tablet    Sig: TAKE TWO TABLETS (250 MG TOTAL) BY MOUTH TWO (TWO) TIMES DAILY.    Dispense:  360 tablet    Refill:  4   Check labs today  Continue current medications Return back in 1 year with Dr. Teresa Coombs

## 2022-06-14 NOTE — Progress Notes (Signed)
PATIENT: Melvin Moore DOB: [birth year]  REASON FOR VISIT: follow up for seizures  HISTORY FROM: patient's sister  Primary Neurologist: Dr. Teresa Coombs   Today 06/14/22 Melvin Moore is here today for follow-up.  On Keppra and Depakote.  Depakote level last visit was 32, Keppra was 12.4, CBC CMP ammonia were normal.  No recent seizures.  Today is a good day, he slept normal last night.  He does not respond verbally.  He has trouble following commands, needs assistance with daily activities.  Has lived with his sister for the last 4 years after their mother passed.  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  Weight is stable over the last year.  Enjoys watching TV.  No falls.  No new issues.  Update 06/14/21 SS: Melvin Moore is a 31 year old male with history of seizures and autism/mental retardation.  Well-controlled on Depakote and Keppra.  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 He just entered this cycle today, just had 8 good days in a row, went to cook out with family yesterday, had great time.  No seizures.  He lives with his sister, she wishes to have Keppra liquid to switch back to tablet. Is difficult to get the syringe and draw up with liquid.  Weight is up about 10 pounds since last visit.  Denies any health issues.  Here today accompanied by his sister.  HISTORY 04/21/2020 Dr. Anne Hahn: Melvin Moore is a 31 year old black gentleman with a history of autism/mental retardation and a history of seizures.  His seizures have been well controlled, he is on low-dose Depakote and he takes Keppra.  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  Over time, he has been very stable with his weight, there has only been a 1 pound difference in his weight over the last 18 months.  The patient  has had no further clinical seizures.  He is taking his medications fairly well.  He returns for an evaluation.  REVIEW OF SYSTEMS: Out of a complete 14 system review of symptoms, the patient complains only of the following symptoms, and all other reviewed systems are negative.  See HPI  ALLERGIES: No Known Allergies  HOME MEDICATIONS: Outpatient Medications Prior to Visit  Medication Sig Dispense Refill   divalproex (DEPAKOTE) 125 MG DR tablet TAKE TWO TABLETS (250 MG TOTAL) BY MOUTH TWO (TWO) TIMES DAILY. MUST KEEP FOLLOW UP 05/26/21 FOR ONGOING REFILLS 360 tablet 4   hydrOXYzine (ATARAX) 25 MG tablet Take 25 mg by mouth daily.     levETIRAcetam (KEPPRA) 500 MG tablet Take 1 tablet (500 mg total) by mouth 2 (two) times daily. 180 tablet 4   mirtazapine (REMERON) 30 MG tablet Take 30 mg by mouth at bedtime.      No facility-administered medications prior to visit.    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   MR (mental retardation)    "75%" per mother   Overactive bladder    Seizures (HCC)     PAST SURGICAL HISTORY: Past Surgical History:  Procedure Laterality Date   LAPAROSCOPIC REVISION VENTRICULAR-PERITONEAL (V-P) SHUNT  11/22/2012   Procedure: LAPAROSCOPIC REVISION VENTRICULAR-PERITONEAL (V-P) SHUNT;  Surgeon: Tawanna Cooler  Laurie Panda, MD;  Location: [facility] OR;  Service: General;  Laterality: N/A;  Laparoscopic Repositioning of Ventricular-Peritoneal Shunt   SHUNT REPLACEMENT     SHUNT REVISION VENTRICULAR-PERITONEAL  11/22/2012   Procedure: SHUNT REVISION VENTRICULAR-PERITONEAL;  Surgeon: Maeola Harman, MD;  Location[facility] OR;  Service: Neurosurgery;  Laterality: N/A;  Laparoscopic Repositioning of Ventricular-Peritoneal shunt     FAMILY HISTORY: Family History  Adopted: Yes    SOCIAL HISTORY: Social History   Socioeconomic History   Marital status:  Single    Spouse name: Not on file   Number of children: 0   Years of education: Day Program   Highest education level: Not on file  Occupational History   Occupation: Disabled  Tobacco Use   Smoking status: Never   Smokeless tobacco: Never  Substance and Sexual Activity   Alcohol use: No   Drug use: No   Sexual activity: Not on file  Other Topics Concern   Not on file  Social History Narrative   Lives at home w/ his guardian Talbert Forest), Insurance underwriter   Social Determinants of Health   Financial Resource Strain: Not on file  Food Insecurity: Not on file  Transportation Needs: Not on file  Physical Activity: Not on file  Stress: Not on file  Social Connections: Not on file  Intimate Partner Violence: Not on file   PHYSICAL EXAM  Vitals:   06/14/22 0804  BP: 111/74  Pulse: 77  Weight: 132 lb (59.9 kg)  Height: 5\' 4"  (1.626 m)    Body mass index is 22.66 kg/m.  Generalized: Well developed, in no acute distress  Neurological examination  Mentation: Alert, difficulty following exam commands, he does not respond verbally Cranial nerve II-XII: Pupils were equal round reactive to light. Extraocular movements were full, visual field were full on confrontational test. Facial sensation and strength were normal. Head turning normal. Tends to move tongue often.  Motor: Good strength all extremities, thumbs are in flexion, more tone to the left arm than right Sensory: Sensory testing is intact to soft touch on all 4 extremities. No evidence of extinction is noted.  Coordination: Could not perform exam commands Gait and station: Can walk independently, is wide-based, left foot rotates out Reflexes: Deep tendon reflexes are symmetric and normal bilaterally.   DIAGNOSTIC DATA (LABS, IMAGING, TESTING) - I reviewed patient records, labs, notes, testing and imaging myself where available.  Lab Results  Component Value Date   WBC 5.9 06/14/2021   HGB 15.7 06/14/2021   HCT 47.5  06/14/2021   MCV 90 06/14/2021   PLT 306 06/14/2021      Component Value Date/Time   NA 142 06/14/2021 0851   K 4.7 06/14/2021 0851   CL 99 06/14/2021 0851   CO2 27 06/14/2021 0851   GLUCOSE 95 06/14/2021 0851   GLUCOSE 155 (H) 10/24/2018 1638   BUN 13 06/14/2021 0851   CREATININE 1.08 06/14/2021 0851   CALCIUM 10.1 06/14/2021 0851   PROT 7.5 06/14/2021 0851   ALBUMIN 4.8 06/14/2021 0851   AST 16 06/14/2021 0851   ALT 18 06/14/2021 0851   ALKPHOS 54 06/14/2021 0851   BILITOT 0.2 06/14/2021 0851   GFRNONAA 88 04/21/2020 1034   GFRAA 101 04/21/2020 1034   No results found for: "CHOL", "HDL", "LDLCALC", "LDLDIRECT", "TRIG", "CHOLHDL" No results found for: "HGBA1C" No results found for: "VITAMINB12" No results found for: "TSH"  ASSESSMENT AND PLAN 31 y.o. year old male  has a past medical history of Autistic disorder, Cerebral  palsy (HCC), Hyperactive, Hypothalamic disorder (HCC) (04/21/2020), MR (mental retardation), Overactive bladder, and Seizures (HCC). here with:  1.  Autism, mental retardation 2.  History of seizures, well controlled 3.  Hypothalamic disorder 4.  VP shunt  -last seizure was in 2019 when Keppra was added to Depakote, that seizure was generalized with loss of awareness  -Check routine labs -Continue Depakote, Keppra -Follow-up in 1 year with Dr. Teresa Coombs to establish care, call for any seizures   Meds ordered this encounter  Medications   levETIRAcetam (KEPPRA) 500 MG tablet    Sig: Take 1 tablet (500 mg total) by mouth 2 (two) times daily.    Dispense:  180 tablet    Refill:  4   divalproex (DEPAKOTE) 125 MG DR tablet    Sig: TAKE TWO TABLETS (250 MG TOTAL) BY MOUTH TWO (TWO) TIMES DAILY.    Dispense:  360 tablet    Refill:  4   Margie Ege, [place], DNP 06/14/2022, 8:17 AM [facility] [address], [address] [place], [place] [address] [phone]

## 2022-06-15 LAB — COMPREHENSIVE METABOLIC PANEL
ALT: 13 IU/L (ref 0–44)
AST: 13 IU/L (ref 0–40)
Albumin/Globulin Ratio: 1.9 (ref 1.2–2.2)
Albumin: 4.4 g/dL (ref 4.0–5.0)
Alkaline Phosphatase: 49 IU/L (ref 44–121)
BUN/Creatinine Ratio: 14 (ref 9–20)
BUN: 13 mg/dL (ref 6–20)
Bilirubin Total: <0.2 mg/dL (ref 0.0–1.2)
CO2: 27 mmol/L (ref 20–29)
Calcium: 9.5 mg/dL (ref 8.7–10.2)
Chloride: 101 mmol/L (ref 96–106)
Creatinine, Ser: 0.91 mg/dL (ref 0.76–1.27)
Globulin, Total: 2.3 g/dL (ref 1.5–4.5)
Glucose: 81 mg/dL (ref 70–99)
Potassium: 4.1 mmol/L (ref 3.5–5.2)
Sodium: 140 mmol/L (ref 134–144)
Total Protein: 6.7 g/dL (ref 6.0–8.5)
eGFR: 115 mL/min/{1.73_m2} (ref >59)

## 2022-06-15 LAB — CBC WITH DIFFERENTIAL/PLATELET
Basophils Absolute: 0 10*3/uL (ref 0.0–0.2)
Basos: 1 %
EOS (ABSOLUTE): 0.2 10*3/uL (ref 0.0–0.4)
Eos: 3 %
Hematocrit: 44.9 % (ref 37.5–51.0)
Hemoglobin: 15.2 g/dL (ref 13.0–17.7)
Immature Grans (Abs): 0 10*3/uL (ref 0.0–0.1)
Immature Granulocytes: 1 %
Lymphocytes Absolute: 1.6 10*3/uL (ref 0.7–3.1)
Lymphs: 27 %
MCH: 30.1 pg (ref 26.6–33.0)
MCHC: 33.9 g/dL (ref 31.5–35.7)
MCV: 89 fL (ref 79–97)
Monocytes Absolute: 0.4 10*3/uL (ref 0.1–0.9)
Monocytes: 7 %
Neutrophils Absolute: 3.7 10*3/uL (ref 1.4–7.0)
Neutrophils: 61 %
Platelets: 294 10*3/uL (ref 150–450)
RBC: 5.05 x10E6/uL (ref 4.14–5.80)
RDW: 13.1 % (ref 11.6–15.4)
WBC: 6 10*3/uL (ref 3.4–10.8)

## 2022-06-15 LAB — VALPROIC ACID LEVEL: Valproic Acid Lvl: 12 ug/mL — ABNORMAL LOW (ref 50–100)

## 2022-06-15 LAB — LEVETIRACETAM LEVEL: Levetiracetam Lvl: 16.9 ug/mL (ref 10.0–40.0)

## 2022-06-20 ENCOUNTER — Telehealth: Payer: Self-pay | Admitting: *Deleted

## 2022-06-20 NOTE — Telephone Encounter (Signed)
I spoke to his sister. She does not have any issues with him taking his medication. Feels like sticking to tablets is okay. Reports he has missed his doses the day prior to labs because she failed to give it to him. Says she typically does not miss doses. Agreeable to be more diligent in making sure not to miss any. She understands to call our office to report any seizure activity.

## 2022-06-20 NOTE — Telephone Encounter (Signed)
-----   Message from Glean Salvo, NP sent at 06/20/2022  9:01 AM EDT ----- Please call patient's sister, CBC and CMP are normal.  Depakote level is on the low end 12, but Keppra is therapeutic.  Please ensure there are no missed doses of medication.  If there are issues with compliance, or getting him to take the medication we can consider different preparations.  Please let me know if they have any questions.

## 2023-06-18 ENCOUNTER — Ambulatory Visit (INDEPENDENT_AMBULATORY_CARE_PROVIDER_SITE_OTHER): Payer: Medicare HMO | Admitting: Neurology

## 2023-06-18 ENCOUNTER — Encounter: Payer: Self-pay | Admitting: Neurology

## 2023-06-18 VITALS — BP 121/81 | HR 90 | Ht 66.0 in | Wt 118.5 lb

## 2023-06-18 DIAGNOSIS — Z5181 Encounter for therapeutic drug level monitoring: Secondary | ICD-10-CM | POA: Diagnosis not present

## 2023-06-18 DIAGNOSIS — G40909 Epilepsy, unspecified, not intractable, without status epilepticus: Secondary | ICD-10-CM

## 2023-06-18 MED ORDER — LEVETIRACETAM 500 MG PO TABS
500.0000 mg | ORAL_TABLET | Freq: Two times a day (BID) | ORAL | 4 refills | Status: DC
Start: 2023-06-18 — End: 2024-06-24

## 2023-06-18 MED ORDER — DIVALPROEX SODIUM 125 MG PO DR TAB
DELAYED_RELEASE_TABLET | ORAL | 4 refills | Status: DC
Start: 2023-06-18 — End: 2024-06-24

## 2023-06-18 NOTE — Patient Instructions (Signed)
Continue current medication including levetiracetam 500 mg twice daily, valproic acid 5 to 250 mg twice daily Will check antiseizure medication level including a liver function test Follow-up in 1 year, can follow-up with Maralyn Sago.

## 2023-06-18 NOTE — Progress Notes (Signed)
PATIENT: Melvin Moore DOB: 02-20-1991  REASON FOR VISIT: follow up for seizures  HISTORY FROM: patient's sister  Primary Neurologist: Dr. Teresa Coombs   Today 06/18/23 Melvin Moore present today for follow-up, he is accompanied by both sister.  Last visit was a year ago since then he has not had any seizure or seizure activity.  He is compliant with his medication.  Her previous antiseizure medication were within normal limit except for low Depakote (he is on 250 mg twice daily).  Sister reported he continued to have good days and bad days.  In his bad days he feels restless, sometimes does not sleep but this is chronic. No other complaints no other concerns. He is at baseline.    Update 06/15/2023 SS: Patient is here today for follow-up.  On Keppra and Depakote.  Depakote level last visit was 32, Keppra was 12.4, CBC CMP ammonia were normal.  No recent seizures.  Today is a good day, he slept normal last night.  He does not respond verbally.  He has trouble following commands, needs assistance with daily activities.  Has lived with his sister for the last 4 years after their mother passed.  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  Weight is stable over the last year.  Enjoys watching TV.  No falls.  No new issues.  Update 06/14/21 SS: Mr. Melvin Moore is a 32 year old male with history of seizures and autism/mental retardation.  Well-controlled on Depakote and Keppra.  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 He just entered this cycle today, just had 8 good days in a row, went to cook out with family yesterday, had great time.  No seizures.  He lives with his sister, she wishes to have Keppra liquid to switch back to tablet. Is difficult to get the syringe and draw up with liquid.  Weight is up about 10 pounds since last visit.  Denies any health issues.  Here today accompanied by his  sister.  HISTORY 04/21/2020 Dr. Anne Hahn: Melvin Moore is a 32 year old black gentleman with a history of autism/mental retardation and a history of seizures.  His seizures have been well controlled, he is on low-dose Depakote and he takes Keppra.  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  Over time, he has been very stable with his weight, there has only been a 1 pound difference in his weight over the last 18 months.  The patient has had no further clinical seizures.  He is taking his medications fairly well.  He returns for an evaluation.  REVIEW OF SYSTEMS: Out of a complete 14 system review of symptoms, the patient complains only of the following symptoms, and all other reviewed systems are negative.  See HPI  ALLERGIES: No Known Allergies  HOME MEDICATIONS: Outpatient Medications Prior to Visit  Medication Sig Dispense Refill   hydrOXYzine (ATARAX) 25 MG tablet Take 25 mg by mouth daily.     mirtazapine (REMERON) 30 MG tablet Take 30 mg by mouth at bedtime.      divalproex (DEPAKOTE) 125 MG DR tablet TAKE TWO TABLETS (250 MG TOTAL) BY MOUTH TWO (TWO) TIMES DAILY. 360 tablet 4   levETIRAcetam (KEPPRA) 500 MG tablet Take 1 tablet (500 mg total) by mouth 2 (two) times daily. 180 tablet 4   No  facility-administered medications prior to visit.    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   MR (mental retardation)    "75%" per mother   Overactive bladder    Seizures (HCC)     PAST SURGICAL HISTORY: Past Surgical History:  Procedure Laterality Date   LAPAROSCOPIC REVISION VENTRICULAR-PERITONEAL (V-P) SHUNT  11/22/2012   Procedure: LAPAROSCOPIC REVISION VENTRICULAR-PERITONEAL (V-P) SHUNT;  Surgeon: Adolph Pollack, MD;  Location: [facility] OR;   Service: General;  Laterality: N/A;  Laparoscopic Repositioning of Ventricular-Peritoneal Shunt   SHUNT REPLACEMENT     SHUNT REVISION VENTRICULAR-PERITONEAL  11/22/2012   Procedure: SHUNT REVISION VENTRICULAR-PERITONEAL;  Surgeon: Maeola Harman, MD;  Location: [facility] OR;  Service: Neurosurgery;  Laterality: N/A;  Laparoscopic Repositioning of Ventricular-Peritoneal shunt     FAMILY HISTORY: Family History  Adopted: Yes    SOCIAL HISTORY: Social History   Socioeconomic History   Marital status: Single    Spouse name: Not on file   Number of children: 0   Years of education: Day Program   Highest education level: Not on file  Occupational History   Occupation: Disabled  Tobacco Use   Smoking status: Never   Smokeless tobacco: Never  Substance and Sexual Activity   Alcohol use: No   Drug use: No   Sexual activity: Not on file  Other Topics Concern   Not on file  Social History Narrative   Lives at home w/ his guardian Talbert Forest), Insurance underwriter   Social Determinants of Health   Financial Resource Strain: Not on file  Food Insecurity: Not on file  Transportation Needs: Not on file  Physical Activity: Not on file  Stress: Not on file  Social Connections: Not on file  Intimate Partner Violence: Not on file   PHYSICAL EXAM  Vitals:   06/18/23 0906  BP: 121/81  Pulse: 90  Weight: 118 lb 8 oz (53.8 kg)  Height: 5\' 6"  (1.676 m)    Body mass index is 19.13 kg/m.  Generalized: Well developed, in no acute distress  Neurological examination  Mentation: Alert, difficulty following exam commands, he does not respond verbally but can mimic, can give Hi fives.  Cranial nerve II-XII: Pupils were equal round reactive to light. Extraocular movements were full, visual field were full on confrontational test. Facial sensation and strength were normal. Head turning normal. Tends to move tongue often.  Motor: Good strength all extremities, thumbs are in flexion, more tone to the left  arm than right Sensory: Sensory testing is intact to soft touch on all 4 extremities. No evidence of extinction is noted.  Coordination: Could not perform exam commands Gait and station: Can walk independently, is wide-based, left foot rotates out Reflexes: Deep tendon reflexes are symmetric and normal bilaterally.   DIAGNOSTIC DATA (LABS, IMAGING, TESTING) - I reviewed patient records, labs, notes, testing and imaging myself where available.  Lab Results  Component Value Date   WBC 6.0 06/14/2022   HGB 15.2 06/14/2022   HCT 44.9 06/14/2022   MCV 89 06/14/2022   PLT 294 06/14/2022      Component Value Date/Time   NA 140 06/14/2022 0832   K 4.1 06/14/2022 0832   CL 101 06/14/2022 0832   CO2 27 06/14/2022 0832   GLUCOSE 81 06/14/2022 0832   GLUCOSE 155 (H) 10/24/2018 1638   BUN 13 06/14/2022 0832   CREATININE 0.91  06/14/2022 0832   CALCIUM 9.5 06/14/2022 0832   PROT 6.7 06/14/2022 0832   ALBUMIN 4.4 06/14/2022 0832   AST 13 06/14/2022 0832   ALT 13 06/14/2022 0832   ALKPHOS 49 06/14/2022 0832   BILITOT <0.2 06/14/2022 0832   GFRNONAA 88 04/21/2020 1034   GFRAA 101 04/21/2020 1034   No results found for: "CHOL", "HDL", "LDLCALC", "LDLDIRECT", "TRIG", "CHOLHDL" No results found for: "HGBA1C" No results found for: "VITAMINB12" No results found for: "TSH"  ASSESSMENT AND PLAN 32 y.o. 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 here with:  1.  Autism, mental retardation 2.  History of seizures, well controlled 3.  Hypothalamic disorder 4.  VP shunt  -last seizure was in 2019 when Keppra was added to Depakote, that seizure was generalized with loss of awareness  -Check routine labs -Continue Depakote 250 mg twice daily and , Keppra 500 mg twice daily -Follow-up in 1 year, with NP   Meds ordered this encounter   Medications   levETIRAcetam (KEPPRA) 500 MG tablet    Sig: Take 1 tablet (500 mg total) by mouth 2 (two) times daily.    Dispense:  180 tablet    Refill:  4   divalproex (DEPAKOTE) 125 MG DR tablet    Sig: TAKE TWO TABLETS (250 MG TOTAL) BY MOUTH TWO (TWO) TIMES DAILY.    Dispense:  360 tablet    Refill:  4    Windell Norfolk, MD 06/18/2023, 9:31 AM [facility] [address] [place], [place] [address] [phone]

## 2023-06-19 ENCOUNTER — Telehealth: Payer: Self-pay

## 2023-06-19 LAB — HEPATIC FUNCTION PANEL
ALT: 12 IU/L (ref 0–44)
AST: 17 IU/L (ref 0–40)
Albumin: 5.1 g/dL (ref 4.1–5.1)
Alkaline Phosphatase: 65 IU/L (ref 44–121)
Bilirubin Total: 0.3 mg/dL (ref 0.0–1.2)
Bilirubin, Direct: <0.1 mg/dL (ref 0.00–0.40)
Total Protein: 8.1 g/dL (ref 6.0–8.5)

## 2023-06-19 LAB — LEVETIRACETAM LEVEL: Levetiracetam Lvl: 15.4 ug/mL (ref 10.0–40.0)

## 2023-06-19 LAB — VALPROIC ACID LEVEL: Valproic Acid Lvl: 32 ug/mL — ABNORMAL LOW (ref 50–100)

## 2023-06-19 NOTE — Telephone Encounter (Signed)
-----   Message from Windell Norfolk, MD sent at 06/19/2023  2:19 PM EDT ----- Please call and advise the patient that the recent labs we checked were within normal limits. The low depakote level reflect the low dose he is taking. No further action is required on these tests at this time. Please remind patient to keep any upcoming appointments or tests and to call us with any interim questions, concerns, problems or updates. Thanks,   Windell Norfolk, MD

## 2023-06-19 NOTE — Progress Notes (Signed)
Please call and advise the patient that the recent labs we checked were within normal limits. The low depakote level reflect the low dose he is taking. No further action is required on these tests at this time. Please remind patient to keep any upcoming appointments or tests and to call us with any interim questions, concerns, problems or updates. Thanks,   Windell Norfolk, MD

## 2023-06-19 NOTE — Telephone Encounter (Signed)
Called and relayed results to sister per DPR. She verbalized understanding. sister had no questions at this time but was encouraged to call back if questions arise.

## 2024-06-18 ENCOUNTER — Ambulatory Visit: Payer: Medicare HMO | Admitting: Neurology

## 2024-06-18 ENCOUNTER — Encounter: Payer: Self-pay | Admitting: Neurology

## 2024-06-18 ENCOUNTER — Other Ambulatory Visit: Payer: Self-pay | Admitting: Neurology

## 2024-06-18 NOTE — Progress Notes (Deleted)
 PATIENT: Melvin Moore DOB: 1991/05/31  REASON FOR VISIT: follow up for seizures  HISTORY FROM: patient's sister  Primary Neurologist: Dr. Gregg   Today 06/18/24 Update 06/18/24 SS: Last visit labs normal LFT, Depakote  32, Keppra  15.4.  06/18/23 Dr. Gregg: Melvin Moore present today for follow-up, he is accompanied by both sister.  Last visit was a year ago since then he has not had any seizure or seizure activity.  He is compliant with his medication.  Her previous antiseizure medication were within normal limit except for low Depakote  (he is on 250 mg twice daily).  Sister reported he continued to have good days and bad days.  In his bad days he feels restless, sometimes does not sleep but this is chronic. No other complaints no other concerns. He is at baseline.    Update 06/15/2023 SS: Patient is here today for follow-up.  On Keppra  and Depakote .  Depakote  level last visit was 32, Keppra  was 12.4, CBC CMP ammonia were normal.  No recent seizures.  Today is a good day, he slept normal last night.  He does not respond verbally.  He has trouble following commands, needs assistance with daily activities.  Has lived with his sister for the last 4 years after their mother passed.  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  Weight is stable over the last year.  Enjoys watching TV.  No falls.  No new issues.  Update 06/14/21 SS: Melvin Moore is a 33 year old male with history of seizures and autism/mental retardation.  Well-controlled on Depakote  and Keppra .  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 He just entered this cycle today, just had 8 good days in a row, went to cook out with family yesterday, had great time.  No seizures.  He lives with his sister, she wishes to have Keppra  liquid to switch back to tablet. Is difficult to get the syringe and draw up with liquid.  Weight is up about 10 pounds since  last visit.  Denies any health issues.  Here today accompanied by his sister.  HISTORY 04/21/2020 Dr. Jenel: Melvin Moore is a 33 year old black gentleman with a history of autism/mental retardation and a history of seizures.  His seizures have been well controlled, he is on low-dose Depakote  and he takes Keppra .  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  Over time, he has been very stable with his weight, there has only been a 1 pound difference in his weight over the last 18 months.  The patient has had no further clinical seizures.  He is taking his medications fairly well.  He returns for an evaluation.  REVIEW OF SYSTEMS: Out of a complete 14 system review of symptoms, the patient complains only of the following symptoms, and all other reviewed systems are negative.  See HPI  ALLERGIES: No Known Allergies  HOME MEDICATIONS: Outpatient Medications Prior to Visit  Medication Sig Dispense Refill   divalproex  (DEPAKOTE ) 125 MG DR tablet TAKE TWO TABLETS (250 MG TOTAL) BY MOUTH TWO (TWO) TIMES DAILY. 360 tablet 4   hydrOXYzine (ATARAX) 25 MG tablet Take 25 mg by mouth daily.     levETIRAcetam  (KEPPRA ) 500 MG tablet Take 1 tablet (500 mg total) by mouth 2 (two) times daily. 180 tablet 4   mirtazapine (REMERON)  30 MG tablet Take 30 mg by mouth at bedtime.      No facility-administered medications prior to visit.    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   MR (mental retardation)    75% per mother   Overactive bladder    Seizures (HCC)     PAST SURGICAL HISTORY: Past Surgical History:  Procedure Laterality Date   LAPAROSCOPIC REVISION VENTRICULAR-PERITONEAL (V-P) SHUNT  11/22/2012   Procedure: LAPAROSCOPIC REVISION  VENTRICULAR-PERITONEAL (V-P) SHUNT;  Surgeon: Krystal JINNY Russell, MD;  Location: [facility] OR;  Service: General;  Laterality: N/A;  Laparoscopic Repositioning of Ventricular-Peritoneal Shunt   SHUNT REPLACEMENT     SHUNT REVISION VENTRICULAR-PERITONEAL  11/22/2012   Procedure: SHUNT REVISION VENTRICULAR-PERITONEAL;  Surgeon: Fairy Levels, MD;  [facility];  Service: Neurosurgery;  Laterality: N/A;  Laparoscopic Repositioning of Ventricular-Peritoneal shunt     FAMILY HISTORY: Family History  Adopted: Yes    SOCIAL HISTORY: Social History   Socioeconomic History   Marital status: Single    Spouse name: Not on file   Number of children: 0   Years of education: Day Program   Highest education level: Not on file  Occupational History   Occupation: Disabled  Tobacco Use   Smoking status: Never   Smokeless tobacco: Never  Substance and Sexual Activity   Alcohol use: No   Drug use: No   Sexual activity: Not on file  Other Topics Concern   Not on file  Social History Narrative   Lives at home w/ his guardian Nickey), Insurance underwriter   Social Drivers of Corporate investment banker Strain: Not on file  Food Insecurity: Not on file  Transportation Needs: Not on file  Physical Activity: Not on file  Stress: Not on file  Social Connections: Not on file  Intimate Partner Violence: Not on file   PHYSICAL EXAM  There were no vitals filed for this visit.   There is no height or weight on file to calculate BMI.  Generalized: Well developed, in no acute distress  Neurological examination  Mentation: Alert, difficulty following exam commands, he does not respond verbally but can mimic, can give Hi fives.  Cranial nerve II-XII: Pupils were equal round reactive to light. Extraocular movements were full, visual field were full on confrontational test. Facial sensation and strength were normal. Head turning normal. Tends to move tongue often.  Motor: Good strength all extremities, thumbs  are in flexion, more tone to the left arm than right Sensory: Sensory testing is intact to soft touch on all 4 extremities. No evidence of extinction is noted.  Coordination: Could not perform exam commands Gait and station: Can walk independently, is wide-based, left foot rotates out Reflexes: Deep tendon reflexes are symmetric and normal bilaterally.   DIAGNOSTIC DATA (LABS, IMAGING, TESTING) - I reviewed patient records, labs, notes, testing and imaging myself where available.  Lab Results  Component Value Date   WBC 6.0 06/14/2022   HGB 15.2 06/14/2022   HCT 44.9 06/14/2022   MCV 89 06/14/2022   PLT 294 06/14/2022      Component Value Date/Time   NA 140 06/14/2022 0832   K 4.1 06/14/2022 0832   CL 101 06/14/2022 0832   CO2 27 06/14/2022 0832   GLUCOSE 81 06/14/2022 0832   GLUCOSE 155 (H) 10/24/2018 1638   BUN 13 06/14/2022 9167  CREATININE 0.91 06/14/2022 0832   CALCIUM 9.5 06/14/2022 0832   PROT 8.1 06/18/2023 0940   ALBUMIN 5.1 06/18/2023 0940   AST 17 06/18/2023 0940   ALT 12 06/18/2023 0940   ALKPHOS 65 06/18/2023 0940   BILITOT 0.3 06/18/2023 0940   GFRNONAA 88 04/21/2020 1034   GFRAA 101 04/21/2020 1034   No results found for: CHOL, HDL, LDLCALC, LDLDIRECT, TRIG, CHOLHDL No results found for: YHAJ8R No results found for: VITAMINB12 No results found for: TSH  ASSESSMENT AND PLAN 33 y.o. 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 here with:  1.  Autism, mental retardation 2.  History of seizures, well controlled 3.  Hypothalamic disorder 4.  VP shunt  -last seizure was in 2019 when Keppra  was added to Depakote , that seizure was generalized with loss of awareness  -Check routine labs -Continue Depakote  250 mg twice daily and , Keppra  500 mg twice daily -Follow-up in 1 year, with  NP   No orders of the defined types were placed in this encounter.   Pastor Falling, MD 06/18/2024, 5:30 AM [facility] [address], [address] [place], [place] [address] [phone]

## 2024-06-23 NOTE — Telephone Encounter (Signed)
 Called pt twice to schedule appt but VM is full.

## 2024-06-23 NOTE — Telephone Encounter (Signed)
 Sister called and got the pt scheduled. Sister verbalized appreciation.

## 2024-06-24 ENCOUNTER — Encounter: Payer: Self-pay | Admitting: Neurology

## 2024-06-24 ENCOUNTER — Ambulatory Visit (INDEPENDENT_AMBULATORY_CARE_PROVIDER_SITE_OTHER): Admitting: Neurology

## 2024-06-24 VITALS — BP 132/68 | Ht 66.0 in | Wt 124.5 lb

## 2024-06-24 DIAGNOSIS — G40909 Epilepsy, unspecified, not intractable, without status epilepticus: Secondary | ICD-10-CM

## 2024-06-24 DIAGNOSIS — R569 Unspecified convulsions: Secondary | ICD-10-CM

## 2024-06-24 DIAGNOSIS — F79 Unspecified intellectual disabilities: Secondary | ICD-10-CM | POA: Diagnosis not present

## 2024-06-24 MED ORDER — LEVETIRACETAM 500 MG PO TABS: 500.0000 mg | ORAL_TABLET | Freq: Two times a day (BID) | ORAL | 4 refills | Status: AC

## 2024-06-24 MED ORDER — DIVALPROEX SODIUM 125 MG PO DR TAB: DELAYED_RELEASE_TABLET | ORAL | 4 refills | Status: AC

## 2024-06-24 NOTE — Progress Notes (Signed)
 PATIENT: Melvin Moore DOB: 21-Nov-1991  REASON FOR VISIT: follow up for seizures  HISTORY FROM: patient's sister  Primary Neurologist: Dr. Gregg   Today 06/24/24 Update 06/24/24 SS: Last visit labs normal LFT, Depakote  32, Keppra  15.4. Here with his sister.  Remains on Depakote  DR 125 mg, 2 tablets twice a day, Keppra  500 mg twice a day. No seizures. Goes to day program, has aide who works with him there. He is nonverbal. He is tired today, often stays up at night watching TV. No health issues or concerns to report today.   06/18/23 Dr. Gregg: Melvin Moore present today for follow-up, he is accompanied by both sister.  Last visit was a year ago since then he has not had any seizure or seizure activity.  He is compliant with his medication.  Her previous antiseizure medication were within normal limit except for low Depakote  (he is on 250 mg twice daily).  Sister reported he continued to have good days and bad days.  In his bad days he feels restless, sometimes does not sleep but this is chronic. No other complaints no other concerns. He is at baseline.    Update 06/15/2023 SS: Patient is here today for follow-up.  On Keppra  and Depakote .  Depakote  level last visit was 32, Keppra  was 12.4, CBC CMP ammonia were normal.  No recent seizures.  Today is a good day, he slept normal last night.  He does not respond verbally.  He has trouble following commands, needs assistance with daily activities.  Has lived with his sister for the last 4 years after their mother passed.  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  Weight is stable over the last year.  Enjoys watching TV.  No falls.  No new issues.  Update 06/14/21 SS: Melvin Moore is a 33 year old male with history of seizures and autism/mental retardation.  Well-controlled on Depakote  and Keppra .  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 He just entered this  cycle today, just had 8 good days in a row, went to cook out with family yesterday, had great time.  No seizures.  He lives with his sister, she wishes to have Keppra  liquid to switch back to tablet. Is difficult to get the syringe and draw up with liquid.  Weight is up about 10 pounds since last visit.  Denies any health issues.  Here today accompanied by his sister.  HISTORY 04/21/2020 Dr. Jenel: Melvin Moore is a 33 year old black gentleman with a history of autism/mental retardation and a history of seizures.  His seizures have been well controlled, he is on low-dose Depakote  and he takes Keppra .  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  Over time, he has been very stable with his weight, there has only been a 1 pound difference in his weight over the last 18 months.  The patient has had no further clinical seizures.  He is taking his medications fairly well.  He returns for an evaluation.  REVIEW OF SYSTEMS: Out of a complete 14 system review of symptoms, the patient complains only of the following symptoms, and all other reviewed systems are negative.  See HPI  ALLERGIES: No Known Allergies  HOME MEDICATIONS: Outpatient Medications Prior to Visit  Medication Sig Dispense Refill   hydrOXYzine (ATARAX) 25 MG tablet  Take 25 mg by mouth daily.     mirtazapine (REMERON) 30 MG tablet Take 30 mg by mouth at bedtime.      divalproex  (DEPAKOTE ) 125 MG DR tablet TAKE TWO TABLETS (250 MG TOTAL) BY MOUTH TWO (TWO) TIMES DAILY. 360 tablet 4   levETIRAcetam  (KEPPRA ) 500 MG tablet Take 1 tablet (500 mg total) by mouth 2 (two) times daily. 180 tablet 4   No facility-administered medications prior to visit.    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   MR  (mental retardation)    75% per mother   Overactive bladder    Seizures (HCC)     PAST SURGICAL HISTORY: Past Surgical History:  Procedure Laterality Date   LAPAROSCOPIC REVISION VENTRICULAR-PERITONEAL (V-P) SHUNT  11/22/2012   Procedure: LAPAROSCOPIC REVISION VENTRICULAR-PERITONEAL (V-P) SHUNT;  Surgeon: Krystal JINNY Russell, MD;  Location: [facility] OR;  Service: General;  Laterality: N/A;  Laparoscopic Repositioning of Ventricular-Peritoneal Shunt   SHUNT REPLACEMENT     SHUNT REVISION VENTRICULAR-PERITONEAL  11/22/2012   Procedure: SHUNT REVISION VENTRICULAR-PERITONEAL;  Surgeon: Fairy Levels, MD;  Location: [facility] OR;  Service: Neurosurgery;  Laterality: N/A;  Laparoscopic Repositioning of Ventricular-Peritoneal shunt     FAMILY HISTORY: Family History  Adopted: Yes    SOCIAL HISTORY: Social History   Socioeconomic History   Marital status: Single    Spouse name: Not on file   Number of children: 0   Years of education: Day Program   Highest education level: Not on file  Occupational History   Occupation: Disabled  Tobacco Use   Smoking status: Never   Smokeless tobacco: Never  Substance and Sexual Activity   Alcohol use: No   Drug use: No   Sexual activity: Not on file  Other Topics Concern   Not on file  Social History Narrative   Lives at home w/ his guardian Melvin Moore), aunt/caregiver      Right handed   Caffeine-none   Social Drivers of Health   Financial Resource Strain: Not on file  Food Insecurity: Not on file  Transportation Needs: Not on file  Physical Activity: Not on file  Stress: Not on file  Social Connections: Not on file  Intimate Partner Violence: Not on file   PHYSICAL EXAM  Vitals:   06/24/24 0800  BP: 132/68  Weight: 124 lb 8 oz (56.5 kg)  Height: 5' 6 (1.676 m)   Body mass index is 20.09 kg/m.  Generalized: Well developed, in no acute distress  Neurological examination  Mentation: Alert, sleepy today, asleep in chair, had to wake up  for exam, non-verbal but does follow exam commands, relies on his sister for history  Cranial nerve II-XII: Pupils were equal round reactive to light. Extraocular movements were full, visual field were full on confrontational test. Facial sensation and strength were normal. Head turning normal.  Motor: Good strength all extremities, thumbs are in flexion, more tone to the left arm than right Sensory: Sensory testing is intact to soft touch on all 4 extremities. No evidence of extinction is noted.  Coordination: Dysmetria with FTN bilaterally  Gait and station: Can walk independently, is wide-based, left foot rotates out, left arm decreased arm swing  DIAGNOSTIC DATA (LABS, IMAGING, TESTING) - I reviewed patient records, labs, notes, testing and imaging myself where available.  Lab Results  Component Value Date   WBC 6.0  06/14/2022   HGB 15.2 06/14/2022   HCT 44.9 06/14/2022   MCV 89 06/14/2022   PLT 294 06/14/2022      Component Value Date/Time   NA 140 06/14/2022 0832   K 4.1 06/14/2022 0832   CL 101 06/14/2022 0832   CO2 27 06/14/2022 0832   GLUCOSE 81 06/14/2022 0832   GLUCOSE 155 (H) 10/24/2018 1638   BUN 13 06/14/2022 0832   CREATININE 0.91 06/14/2022 0832   CALCIUM 9.5 06/14/2022 0832   PROT 8.1 06/18/2023 0940   ALBUMIN 5.1 06/18/2023 0940   AST 17 06/18/2023 0940   ALT 12 06/18/2023 0940   ALKPHOS 65 06/18/2023 0940   BILITOT 0.3 06/18/2023 0940   GFRNONAA 88 04/21/2020 1034   GFRAA 101 04/21/2020 1034   No results found for: CHOL, HDL, LDLCALC, LDLDIRECT, TRIG, CHOLHDL No results found for: YHAJ8R No results found for: VITAMINB12 No results found for: TSH  ASSESSMENT AND PLAN 33 y.o. 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 here with:  1.  Autism, mental retardation 2.  History of  seizures, well controlled 3.  Hypothalamic disorder 4.  VP shunt  -Doing overall well, last seizure was in 2019 when Keppra  was added to Depakote , that seizure was generalized with loss of awareness  -Check routine labs today  -Continue Depakote  250 mg twice daily and , Keppra  500 mg twice daily -Follow-up in 1 year with me, call for seizure activity   Meds ordered this encounter  Medications   levETIRAcetam  (KEPPRA ) 500 MG tablet    Sig: Take 1 tablet (500 mg total) by mouth 2 (two) times daily.    Dispense:  180 tablet    Refill:  4   divalproex  (DEPAKOTE ) 125 MG DR tablet    Sig: TAKE TWO TABLETS (250 MG TOTAL) BY MOUTH TWO (TWO) TIMES DAILY.    Dispense:  360 tablet    Refill:  4   Lauraine Gayland MANDES, [facility]  [facility] [address] [place], [place] [address] [phone]

## 2024-06-24 NOTE — Patient Instructions (Signed)
 Nice to see you today.  We will continue Keppra  and Depakote  for seizure prevention.  We will check labs today.  Please call for seizure activity.  Follow-up in 1 year.  Thanks!!

## 2025-06-24 ENCOUNTER — Ambulatory Visit: Admitting: Neurology
